# Patient Record
Sex: Male | Born: 1990 | Race: White | Hispanic: No | Marital: Single | Smoking: Current every day smoker
Health system: Southern US, Community
[De-identification: ages and names within clinical notes are randomized; demographics above are authoritative.]

## PROBLEM LIST (undated history)

## (undated) DIAGNOSIS — B029 Zoster without complications: Secondary | ICD-10-CM

## (undated) HISTORY — PX: APPENDECTOMY: SHX54

---
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
## 2011-01-29 ENCOUNTER — Emergency Department: Payer: Self-pay | Admitting: Emergency Medicine

## 2012-11-04 ENCOUNTER — Emergency Department: Payer: Self-pay | Admitting: Unknown Physician Specialty

## 2012-11-04 LAB — URINALYSIS, COMPLETE
Bilirubin,UR: NEGATIVE
Glucose,UR: NEGATIVE mg/dL (ref 0–75)
Nitrite: NEGATIVE
Ph: 8 (ref 4.5–8.0)
RBC,UR: 6 /HPF (ref 0–5)
Specific Gravity: 1.026 (ref 1.003–1.030)
WBC UR: <1 /HPF (ref 0–5)

## 2012-11-04 LAB — COMPREHENSIVE METABOLIC PANEL
Albumin: 5.1 g/dL — ABNORMAL HIGH (ref 3.4–5.0)
Anion Gap: 7 (ref 7–16)
Chloride: 106 mmol/L (ref 98–107)
Co2: 24 mmol/L (ref 21–32)
Creatinine: 0.78 mg/dL (ref 0.60–1.30)
EGFR (African American): >60
EGFR (Non-African Amer.): >60
Glucose: 112 mg/dL — ABNORMAL HIGH (ref 65–99)
Osmolality: 274 (ref 275–301)
SGOT(AST): 23 U/L (ref 15–37)
SGPT (ALT): 19 U/L (ref 12–78)
Total Protein: 8.5 g/dL — ABNORMAL HIGH (ref 6.4–8.2)

## 2012-11-04 LAB — CBC
MCH: 29.9 pg (ref 26.0–34.0)
MCV: 89 fL (ref 80–100)
Platelet: 223 10*3/uL (ref 150–440)
RBC: 5.23 10*6/uL (ref 4.40–5.90)
RDW: 12.9 % (ref 11.5–14.5)

## 2013-02-10 LAB — URINALYSIS, COMPLETE
Bilirubin,UR: NEGATIVE
Glucose,UR: 50 mg/dL (ref 0–75)
Leukocyte Esterase: NEGATIVE
Specific Gravity: 1.026 (ref 1.003–1.030)
Squamous Epithelial: <1
WBC UR: <1 /HPF (ref 0–5)

## 2013-02-10 LAB — COMPREHENSIVE METABOLIC PANEL
Anion Gap: 4 — ABNORMAL LOW (ref 7–16)
BUN: 9 mg/dL (ref 7–18)
Bilirubin,Total: 1.2 mg/dL — ABNORMAL HIGH (ref 0.2–1.0)
Calcium, Total: 9.7 mg/dL (ref 8.5–10.1)
Chloride: 104 mmol/L (ref 98–107)
Co2: 28 mmol/L (ref 21–32)
Creatinine: 1.02 mg/dL (ref 0.60–1.30)
EGFR (Non-African Amer.): >60
Glucose: 173 mg/dL — ABNORMAL HIGH (ref 65–99)
Osmolality: 275 (ref 275–301)
Potassium: 4.1 mmol/L (ref 3.5–5.1)
SGOT(AST): 21 U/L (ref 15–37)
Total Protein: 8.1 g/dL (ref 6.4–8.2)

## 2013-02-10 LAB — CBC
HCT: 48 % (ref 40.0–52.0)
MCH: 29.5 pg (ref 26.0–34.0)
Platelet: 235 10*3/uL (ref 150–440)
RBC: 5.38 10*6/uL (ref 4.40–5.90)
RDW: 13.8 % (ref 11.5–14.5)
WBC: 21.9 10*3/uL — ABNORMAL HIGH (ref 3.8–10.6)

## 2013-02-11 ENCOUNTER — Inpatient Hospital Stay: Payer: Self-pay | Admitting: Surgery

## 2013-02-13 LAB — CBC WITH DIFFERENTIAL/PLATELET
Basophil %: 0.3 %
Eosinophil #: 0.1 10*3/uL (ref 0.0–0.7)
Eosinophil %: 0.6 %
HCT: 46.4 % (ref 40.0–52.0)
HGB: 15.7 g/dL (ref 13.0–18.0)
Lymphocyte %: 10.2 %
MCHC: 33.9 g/dL (ref 32.0–36.0)
MCV: 91 fL (ref 80–100)
Monocyte %: 8.5 %
Platelet: 230 10*3/uL (ref 150–440)
RBC: 5.11 10*6/uL (ref 4.40–5.90)

## 2013-02-13 LAB — BASIC METABOLIC PANEL
BUN: 13 mg/dL (ref 7–18)
Calcium, Total: 9.2 mg/dL (ref 8.5–10.1)
Chloride: 102 mmol/L (ref 98–107)
EGFR (Non-African Amer.): >60
Potassium: 4 mmol/L (ref 3.5–5.1)

## 2013-02-14 LAB — CBC WITH DIFFERENTIAL/PLATELET
Basophil #: 0.1 10*3/uL (ref 0.0–0.1)
Eosinophil #: 0.1 10*3/uL (ref 0.0–0.7)
HGB: 13.5 g/dL (ref 13.0–18.0)
MCH: 30.3 pg (ref 26.0–34.0)
MCHC: 33.6 g/dL (ref 32.0–36.0)
MCV: 90 fL (ref 80–100)
Monocyte #: 1.3 x10 3/mm — ABNORMAL HIGH (ref 0.2–1.0)
Monocyte %: 9.6 %
Neutrophil #: 11 10*3/uL — ABNORMAL HIGH (ref 1.4–6.5)
Neutrophil %: 79.2 %
Platelet: 203 10*3/uL (ref 150–440)
RBC: 4.46 10*6/uL (ref 4.40–5.90)

## 2013-02-15 LAB — CBC WITH DIFFERENTIAL/PLATELET
Basophil #: 0.1 10*3/uL (ref 0.0–0.1)
Basophil %: 0.4 %
Eosinophil #: 0.2 10*3/uL (ref 0.0–0.7)
HCT: 39.8 % — ABNORMAL LOW (ref 40.0–52.0)
HGB: 13.7 g/dL (ref 13.0–18.0)
Lymphocyte #: 1.7 10*3/uL (ref 1.0–3.6)
Lymphocyte %: 10.5 %
MCH: 30.6 pg (ref 26.0–34.0)
MCHC: 34.4 g/dL (ref 32.0–36.0)
MCV: 89 fL (ref 80–100)
Monocyte %: 10 %
Neutrophil #: 12.9 10*3/uL — ABNORMAL HIGH (ref 1.4–6.5)
RDW: 13.8 % (ref 11.5–14.5)

## 2013-11-15 ENCOUNTER — Emergency Department: Payer: Self-pay | Admitting: Emergency Medicine

## 2013-11-15 LAB — CBC WITH DIFFERENTIAL/PLATELET
Basophil #: 0.1 10*3/uL (ref 0.0–0.1)
Basophil %: 1.1 %
EOS PCT: 1.8 %
Eosinophil #: 0.2 10*3/uL (ref 0.0–0.7)
HCT: 43.3 % (ref 40.0–52.0)
HGB: 14.7 g/dL (ref 13.0–18.0)
LYMPHS ABS: 2.9 10*3/uL (ref 1.0–3.6)
Lymphocyte %: 32.1 %
MCH: 30.6 pg (ref 26.0–34.0)
MCHC: 33.8 g/dL (ref 32.0–36.0)
MCV: 90 fL (ref 80–100)
Monocyte #: 0.7 x10 3/mm (ref 0.2–1.0)
Monocyte %: 8.1 %
NEUTROS ABS: 5.1 10*3/uL (ref 1.4–6.5)
NEUTROS PCT: 56.9 %
Platelet: 203 10*3/uL (ref 150–440)
RBC: 4.8 10*6/uL (ref 4.40–5.90)
RDW: 12.6 % (ref 11.5–14.5)
WBC: 8.9 10*3/uL (ref 3.8–10.6)

## 2013-11-15 LAB — URINALYSIS, COMPLETE
BILIRUBIN, UR: NEGATIVE
Bacteria: NONE SEEN
Blood: NEGATIVE
GLUCOSE, UR: NEGATIVE mg/dL (ref 0–75)
KETONE: NEGATIVE
Leukocyte Esterase: NEGATIVE
Nitrite: NEGATIVE
Ph: 7 (ref 4.5–8.0)
Protein: NEGATIVE
RBC,UR: <1 /HPF (ref 0–5)
SPECIFIC GRAVITY: 1.019 (ref 1.003–1.030)
Squamous Epithelial: <1

## 2013-11-15 LAB — COMPREHENSIVE METABOLIC PANEL
ALK PHOS: 76 U/L
AST: 27 U/L (ref 15–37)
Albumin: 4.2 g/dL (ref 3.4–5.0)
Anion Gap: 1 — ABNORMAL LOW (ref 7–16)
BILIRUBIN TOTAL: 0.3 mg/dL (ref 0.2–1.0)
BUN: 10 mg/dL (ref 7–18)
CO2: 31 mmol/L (ref 21–32)
Calcium, Total: 9 mg/dL (ref 8.5–10.1)
Chloride: 106 mmol/L (ref 98–107)
Creatinine: 0.81 mg/dL (ref 0.60–1.30)
Glucose: 92 mg/dL (ref 65–99)
Osmolality: 274 (ref 275–301)
Potassium: 4.5 mmol/L (ref 3.5–5.1)
SGPT (ALT): 36 U/L (ref 12–78)
Sodium: 138 mmol/L (ref 136–145)
TOTAL PROTEIN: 7.8 g/dL (ref 6.4–8.2)

## 2013-11-15 LAB — PROTIME-INR
INR: 1
Prothrombin Time: 13 secs (ref 11.5–14.7)

## 2013-11-15 LAB — LIPASE, BLOOD: Lipase: 1006 U/L — ABNORMAL HIGH (ref 73–393)

## 2014-11-22 NOTE — Discharge Summary (Signed)
PATIENT NAME:  Bill Harmon, Dequavius L MR#:  [id number] DATE OF BIRTH:  05/27/1991  DATE OF ADMISSION:  02/11/2013 DATE OF DISCHARGE:  02/15/2013  PRINCIPAL DIAGNOSIS: Ruptured acute appendicitis with pelvic abscess.   PRINCIPAL PROCEDURE PERFORMED DURING THIS ADMISSION: Laparoscopic appendectomy and laparoscopic abscess drainage.   HOSPITAL COURSE: The patient was admitted to the hospital following the above-mentioned procedure for the above-mentioned diagnosis and was kept on IV antibiotics until discharge. His vomiting and subsequent hiccups both resolved, and he was tolerating a regular diet and he was switched to oral antibiotics prior to discharge.   The patient was given an appointment to follow up in my office and call in the interim for any problems.   ____________________________ Claude MangesWilliam F. Stockton Nunley, MD wfm:gb D: 02/21/2013 04:22:49 ET T: 02/21/2013 04:42:12 ET JOB#: [id number]  cc: Claude MangesWilliam F. Ellisha Bankson, MD, <Dictator> Claude MangesWILLIAM F Maylee Bare MD ELECTRONICALLY SIGNED 02/21/2013 20:39

## 2014-11-22 NOTE — H&P (Signed)
History of Present Illness 22 yowm who began experiencing generalized severe, squeezing-type abdominal pain yesterday afternoon. He has been nauseous, vomited multiple times, and had chills, but no fever. He was unable to sleep last night. He had some water prior to arrival but his last real meal was yesterday; he is anorexic.   Past Med/Surgical Hx:  Asthma:   Denies surgical history.:   ALLERGIES:  No Known Allergies:   Family and Social History:  Family History Non-Contributory   Social History positive  tobacco (Current within 1 year), positive ETOH, positive Illicit drugs, single, cook at [employer], smokes 1/2 ppd, drinks rarely, smokes marijuana   Place of Living Home   Review of Systems:  Fever/Chills Yes   Cough No   Sputum No   Abdominal Pain Yes   Diarrhea No   Constipation No   Nausea/Vomiting Yes   SOB/DOE No   Chest Pain No   Dysuria No   Tolerating PT Yes   Tolerating Diet No  Nauseated  Vomiting   Medications/Allergies Reviewed Medications/Allergies reviewed   Physical Exam:  GEN well developed, well nourished, no acute distress, thin   HEENT pink conjunctivae, PERRL, hearing intact to voice, moist oral mucosa, Oropharynx clear   NECK supple  trachea midline   RESP normal resp effort  clear BS  no use of accessory muscles   CARD regular rate  no murmur  no JVD  no Rub   ABD positive tenderness  generalized rebound, worse in RLQ   LYMPH negative neck   EXTR negative cyanosis/clubbing, negative edema   SKIN normal to palpation, No rashes, skin turgor good   NEURO cranial nerves intact, follows commands, motor/sensory function intact   PSYCH alert, A+O to time, place, person, good insight   Lab Results: Hepatic:  12-Jul-14 06:54   Bilirubin, Total  1.2  Alkaline Phosphatase 88  SGPT (ALT) 23  SGOT (AST) 21  Total Protein, Serum 8.1  Albumin, Serum 4.4  Routine Chem:  12-Jul-14 06:54   Lipase 149 (Result(s) reported on  10 Feb 2013 at 07:31AM.)  Glucose, Serum  173  BUN 9  Creatinine (comp) 1.02  Sodium, Serum 136  Potassium, Serum 4.1  Chloride, Serum 104  CO2, Serum 28  Calcium (Total), Serum 9.7  Osmolality (calc) 275  eGFR (African American) >60  eGFR (Non-African American) >60 (eGFR values <8m/min/1.73 m2 may be an indication of chronic kidney disease (CKD). Calculated eGFR is useful in patients with stable renal function. The eGFR calculation will not be reliable in acutely ill patients when serum creatinine is changing rapidly. It is not useful in  patients on dialysis. The eGFR calculation may not be applicable to patients at the low and high extremes of body sizes, pregnant women, and vegetarians.)  Anion Gap  4  Routine UA:  12-Jul-14 06:54   Color (UA) Amber  Clarity (UA) Hazy  Glucose (UA) 50 mg/dL  Bilirubin (UA) Negative  Ketones (UA) Trace  Specific Gravity (UA) 1.026  Blood (UA) 1+  pH (UA) 6.0  Protein (UA) 30 mg/dL  Nitrite (UA) Negative  Leukocyte Esterase (UA) Negative (Result(s) reported on 10 Feb 2013 at 07:19AM.)  RBC (UA) 4 /HPF  WBC (UA) <1 /HPF  Bacteria (UA) TRACE  Epithelial Cells (UA) <1 /HPF  Mucous (UA) PRESENT (Result(s) reported on 10 Feb 2013 at 07:19AM.)  Routine Hem:  12-Jul-14 06:54   WBC (CBC)  21.9  RBC (CBC) 5.38  Hemoglobin (CBC) 15.9  Hematocrit (CBC) 48.0  Platelet Count (CBC) 235 (Result(s) reported on 10 Feb 2013 at 07:11AM.)  MCV 89  MCH 29.5  MCHC 33.1  RDW 13.8   Radiology Results: XRay:    12-Jul-14 07:26, Chest Portable Single View  Chest Portable Single View  REASON FOR EXAM:    eval for free air, severe abd pain  COMMENTS:       PROCEDURE: DXR - DXR PORTABLE CHEST SINGLE VIEW  - Feb 10 2013  7:26AM     RESULT: The lungs are clear. The heart and pulmonary vessels are normal.   The bony and mediastinal structures are unremarkable. There is no   effusion. There is no pneumothorax or evidence of congestive  failure.    IMPRESSION:  No acute cardiopulmonary disease.    Dictation Site: 6        Verified By: Sundra Aland, M.D., MD  LabUnknown:  PACS Image    12-Jul-14 07:42, CT Abdomen and Pelvis With Contrast  PACS Image  CT:  CT Abdomen and Pelvis With Contrast  REASON FOR EXAM:    (1) severe abd pain with guarding and rebound r>l;   (2) same  COMMENTS:       PROCEDURE: CT  - CT ABDOMEN / PELVIS  W  - Feb 10 2013  7:42AM     RESULT: CT of the abdomen and pelvis is performed utilizing 100 mL of   Isovue-370 iodinated intravenous contrast. Unfortunately, oral contrast   was not utilized. This was apparently at the request of the requesting   physician. The lung bases appear clear. The liver, gallbladder, spleen,   pancreas, adrenal glands, kidneys and abdominal aorta appear to be   unremarkable. There is a moderate amount of fluid in the stomach. The   optimal small bowel is slightly distended in the area the duodenum   without evidence of obstructive change. There is no pneumoperitoneum or   pneumatosis evident. There appear to be some possible inflammatory     changes in the right lower quadrant with a small amount of free fluid.   Unfortunately, given the lack of oral contrast the appendix is not well   seen. There may be some air in the appendix on image 110 toward the right   the pelvic region but the appendix is not clearly seen throughout its   length. There appears to be a small amount of fluid in the right lower   quadrant. The urinary bladder contains a small amount of fluid.    IMPRESSION:   1. There appears to be some free fluid in the right lower quadrant and   dependent region of the pelvis with evidence of inflammatory changes in   the right lower quadrant which could be secondary to enteritis or   possibly appendicitis although the appendix is not well seen. Close   clinical and laboratory correlation is recommended. The lack of oral   contrast limits  visualization of bowel loops in the pelvis in this   patient.  Dictation Site: 6        Verified By: Sundra Aland, M.D., MD    Assessment/Admission Diagnosis Ruptured Acute Appendicitis   Plan IVF, IV ABx, Lap Appy   Electronic Signatures: Consuela Mimes (MD)  (Signed 12-Jul-14 08:48)  Authored: CHIEF COMPLAINT and HISTORY, PAST MEDICAL/SURGIAL HISTORY, ALLERGIES, FAMILY AND SOCIAL HISTORY, REVIEW OF SYSTEMS, PHYSICAL EXAM, LABS, Radiology, ASSESSMENT AND PLAN   Last Updated: 12-Jul-14 08:48 by Consuela Mimes (MD)

## 2014-11-22 NOTE — Op Note (Signed)
PATIENT NAME:  Bill Harmon, Bill Harmon MR#:  [id number] DATE OF BIRTH:  09-01-1990  DATE OF PROCEDURE:  02/10/2013  PREOPERATIVE DIAGNOSIS: Ruptured acute appendicitis.   POSTOPERATIVE DIAGNOSES: Acute appendicitis plus pelvic abscess.   SURGEON: Claude Manges, M.D.   ANESTHESIA: General.   OPERATION PERFORMED: 1.  Laparoscopic appendectomy.  2.  Laparoscopic drainage of pelvic abscess.   PROCEDURE IN DETAIL: The patient was placed supine on the operating room table and prepped and draped in the usual sterile fashion. An incision was made above the umbilicus in the midline and carried down through the linea alba and a Hasson cannula was introduced into the peritoneum amidst horizontal mattress sutures of 0 Vicryl. A 15 mmHg CO2 pneumoperitoneum was created. Two additional 5 mm trocars were placed under direct visualization. The patient was noted to have at least 50 mL of pus in the pelvis that was free-floating, and there was also some pus along the right gutter. There were dense adhesions from the ascending colon and the cecum to the right gutter and the right pelvic sidewall, and there were also dense adhesions from the terminal ileum to the pelvis. These adhesions were taken down with the Harmonic scalpel, and the entire ileocecal area of intestine was rotated medially in order to expose the appendix. In doing so, there was an additional fairly mature pelvic abscess that was entered that had been walled off by the pelvis and pelvic sidewall and small intestine. This was all suctioned clear, and preliminary irrigation was performed. The appendix was plastered to the right pelvic sidewall, and the proximal half of the appendix appeared completely normal. The distal half of the appendix appeared very cyanotic, and there appeared to be a rupture in the mid portion of the appendix. The appendix was tediously dissected off of the pelvic sidewall and in doing so, a single small arterial bleeder was  encountered and this was controlled with 3 large hemoclips. I never identified the ureter, and it is possible that these clips were placed close to the ureter, although they certainly were not placed across it. The mesoappendix was taken down with the Harmonic scalpel, and the appendectomy was performed with an Endo GIA stapling device and the appendix was placed in an Endo Catch bag and extracted from the abdomen via the supraumbilical port. The appendectomy was complete, leaving no remaining appendix in the patient, as it was done just at the junction of the appendix with the cecum. I then performed  extensive irrigation of the areas of the previous abscesses, including the right gutter and pelvis and distal small intestine, to include almost 2 liters of warm normal saline. This was suctioned clear from both the right and left subdiaphragmatic areas as well as the right gutter and pelvis. I then pulled the (very diminutive and thin) omentum over top of the small intestine and the cecum, such that it reached the right gutter and the dissected area in the pelvis. It did not quite reach the bottom of the pelvis, and the previously adherent terminal ileum had slid back into that area. The peritoneum was then desufflated and decannulated, and the linea alba was closed with a running 0 PDS suture and the previously placed Vicryls were tied one to another, and all 3 skin sites were closed with subcuticular 5-0 Monocryl and suture strips. The patient tolerated the procedure well. There were no complications.     ____________________________ Claude Manges, MD wfm:jm D: 02/10/2013 12:29:23 ET T: 02/10/2013 13:47:49 ET JOB#:  [id number]  cc: Claude MangesWilliam F. Brandin Stetzer, MD, <Dictator> Claude MangesWILLIAM F Mayer Vondrak MD ELECTRONICALLY SIGNED 02/10/2013 14:14

## 2014-12-13 IMAGING — CT CT ABD-PELV W/ CM
2 of 4 series · 17 of 46 positions shown, 19 images · IV contrast (agent unspecified)
Comparison: CT scan dated 02/10/2013

CLINICAL DATA: Abdominal pain and tenderness.

EXAM:
CT ABDOMEN AND PELVIS WITH CONTRAST
TECHNIQUE: Multidetector CT imaging of the abdomen and pelvis was performed
using the standard protocol following bolus administration of
intravenous contrast.
CONTRAST:  100 cc 8sovue-QLL

[Series 2: routine abd pel with · axial · 0.70mm/px · z∈[+152,+572]mm · 14 of 92 slices shown, 16 images]
[im 4/92  soft-tissue]
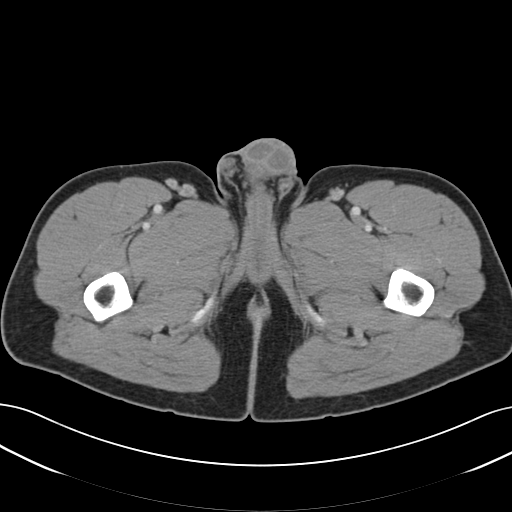
[im 4/92  bone]
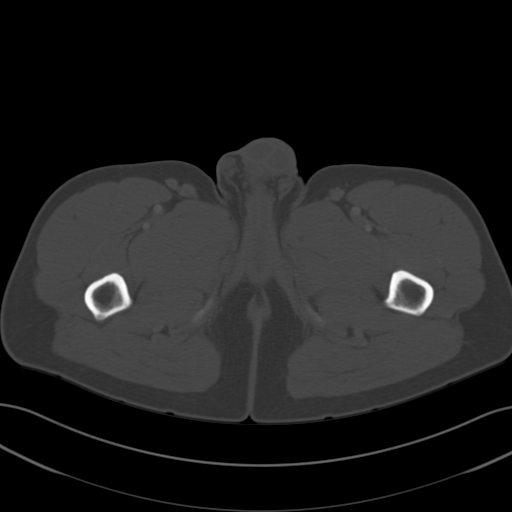
[im 11/92  soft-tissue]
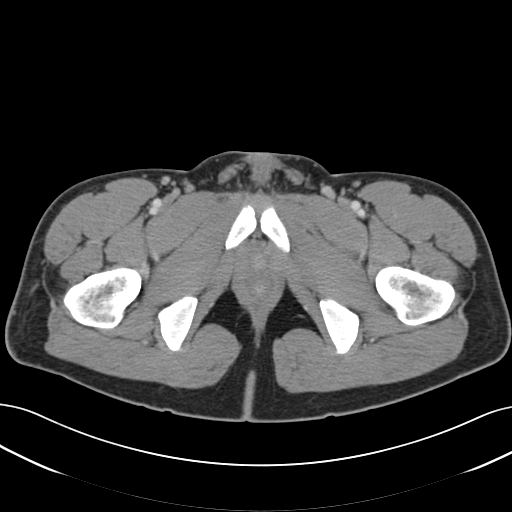
[im 19/92  soft-tissue]
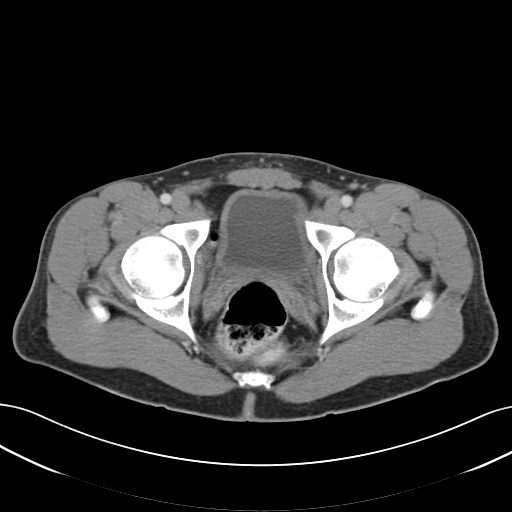
[im 26/92  soft-tissue]
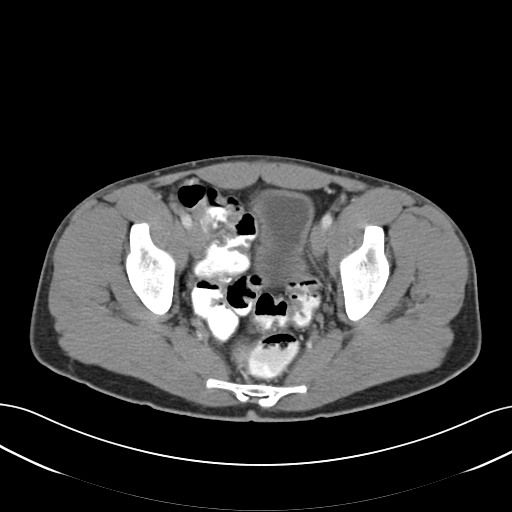
[im 30/92  soft-tissue]
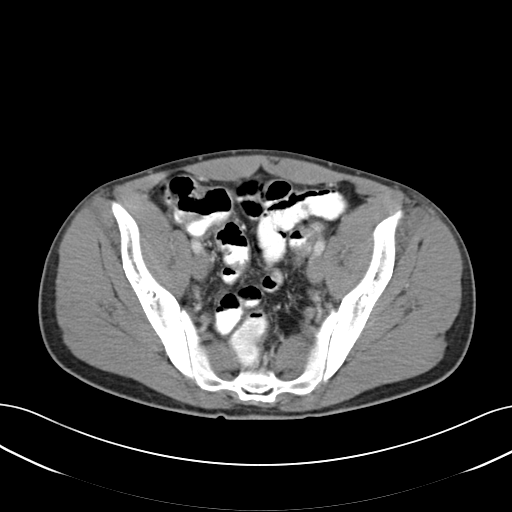
[im 37/92  soft-tissue]
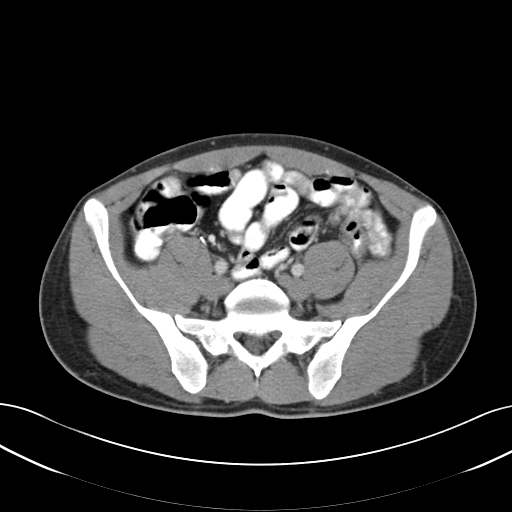
[im 44/92  soft-tissue]
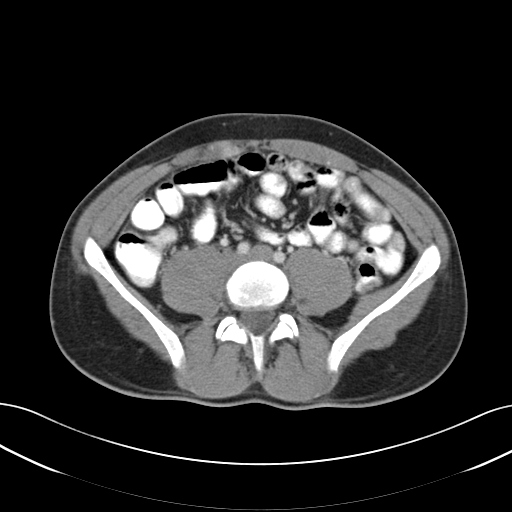
[im 48/92  soft-tissue]
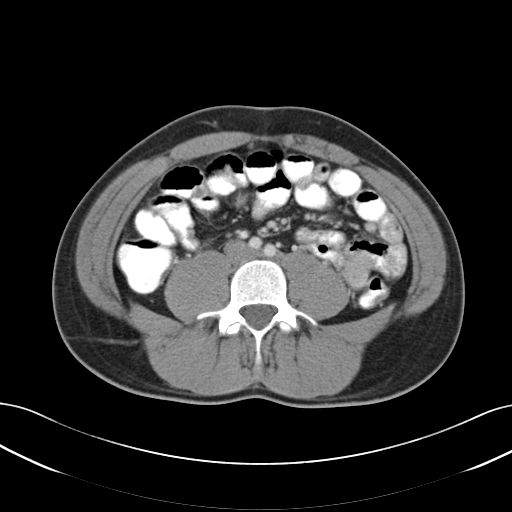
[im 55/92  soft-tissue]
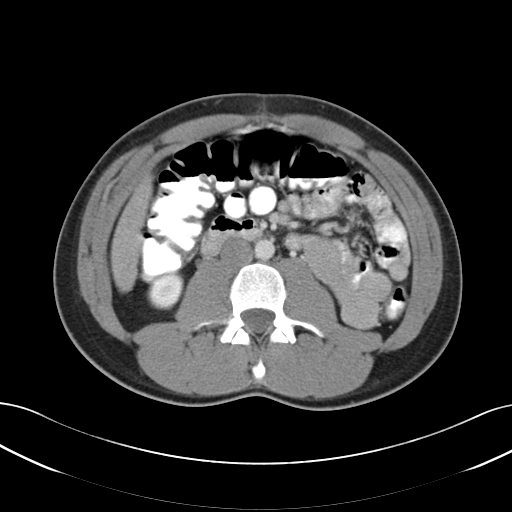
[im 55/92  bone]
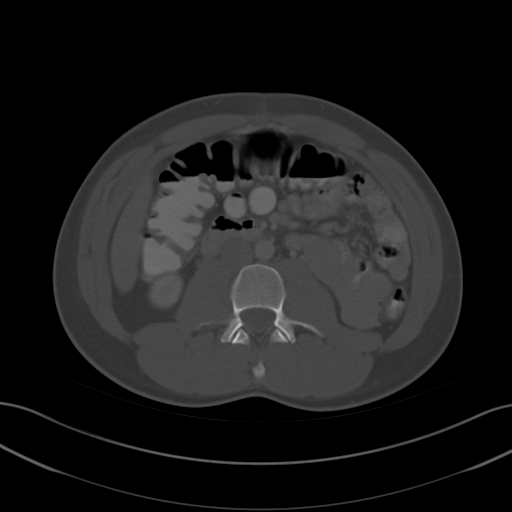
[im 62/92  soft-tissue]
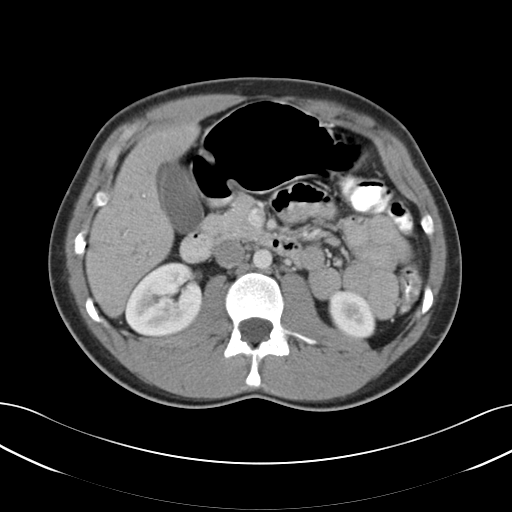
[im 70/92  soft-tissue]
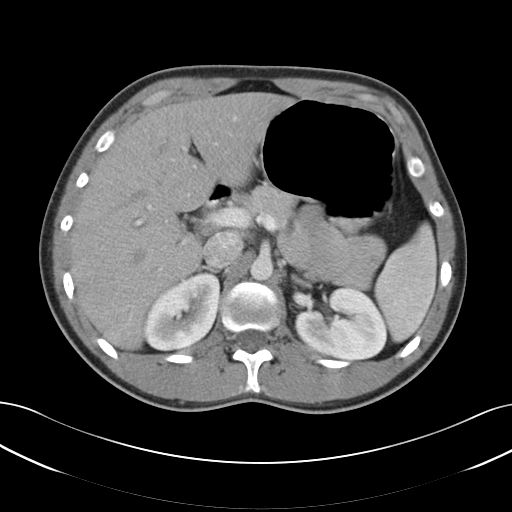
[im 73/92  soft-tissue]
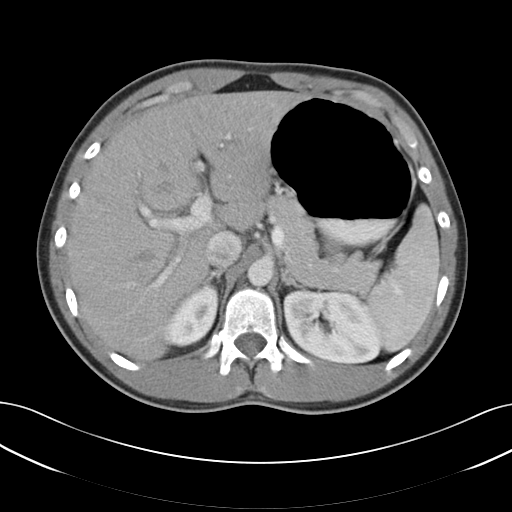
[im 81/92  soft-tissue]
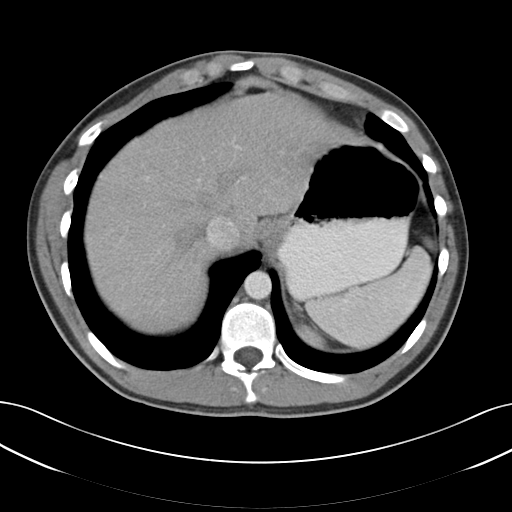
[im 88/92  soft-tissue]
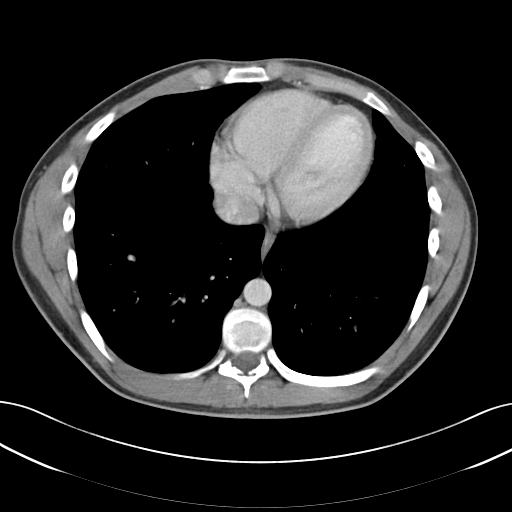

[Series 5: cor routine abd pel with · coronal · 0.69mm/px · 3 of 118 slices shown]
[im 40/118  soft-tissue]
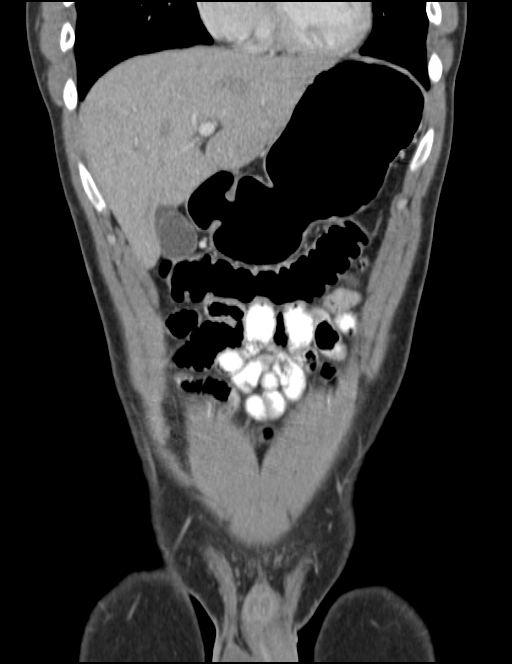
[im 53/118  soft-tissue]
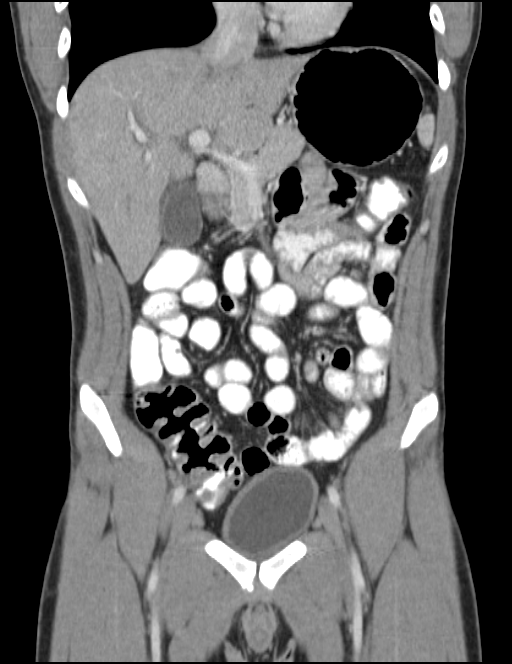
[im 66/118  soft-tissue]
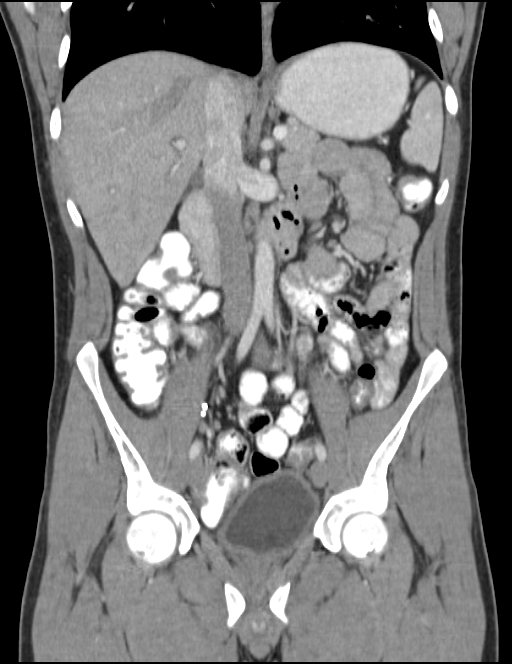

[17 of 46 positions shown; findings below may reference images not displayed]

FINDINGS: There has slight edema of the gallbladder wall buttock gallbladder
is not distended. There are no dilated bile ducts.

Liver parenchyma, spleen, pancreas, adrenal glands, and kidneys are
normal. The bowel is normal including the terminal ileum. Appendix
has been removed. There is no free air or free fluid in the abdomen.
No osseous abnormality. No adenopathy.
IMPRESSION: Slight nonspecific edema in the gallbladder wall. Otherwise benign
appearing abdomen.

## 2014-12-18 ENCOUNTER — Ambulatory Visit
Admission: EM | Admit: 2014-12-18 | Discharge: 2014-12-18 | Disposition: A | Discharge status: Home / Self Care | Payer: BC Managed Care – PPO | Attending: Family Medicine | Admitting: Family Medicine

## 2014-12-18 DIAGNOSIS — B029 Zoster without complications: Secondary | ICD-10-CM

## 2014-12-18 MED ORDER — HYDROCODONE-ACETAMINOPHEN 5-325 MG PO TABS: ORAL_TABLET | ORAL | Status: DC

## 2014-12-18 MED ORDER — VALACYCLOVIR HCL 1 G PO TABS: 1000.0000 mg | ORAL_TABLET | Freq: Three times a day (TID) | ORAL | Status: DC

## 2014-12-18 NOTE — ED Provider Notes (Signed)
CSN: [id number]     Arrival date & time 12/18/14  1842 History   First MD Initiated Contact with Patient 12/18/14 1919     Chief Complaint  Patient presents with  . Rash   (Consider location/radiation/quality/duration/timing/severity/associated sxs/prior Treatment) Patient is a 24 y.o. male presenting with rash. The history is provided by the patient.  Rash Location:  Torso Torso rash location:  R axilla, R chest and upper back Quality: blistering, burning, painful and redness   Pain details:    Quality:  Burning   Severity:  Moderate   Onset quality:  Sudden   Timing:  Constant Severity:  Moderate Context: not animal contact, not chemical exposure, not diapers, not eggs, not exposure to similar rash, not food, not hot tub use, not insect bite/sting, not medications, not new detergent/soap, not nuts, not plant contact, not pollen, not pregnancy, not sick contacts and not sun exposure   Ineffective treatments:  None tried Associated symptoms: no abdominal pain and no sore throat     History reviewed. No pertinent past medical history. Past Surgical History  Procedure Laterality Date  . Appendectomy     No family history on file. History  Substance Use Topics  . Smoking status: Never Smoker   . Smokeless tobacco: Not on file  . Alcohol Use: No    Review of Systems  HENT: Negative for sore throat.   Gastrointestinal: Negative for abdominal pain.  Skin: Positive for rash.    Allergies  Review of patient's allergies indicates no known allergies.  Home Medications   Prior to Admission medications   Medication Sig Start Date End Date Taking? Authorizing Provider  HYDROcodone-acetaminophen (NORCO/VICODIN) 5-325 MG per tablet 1-2 tabs po qd prn 12/18/14   Payton Mccallumrlando Dorotea Hand, MD  valACYclovir (VALTREX) 1000 MG tablet Take 1 tablet (1,000 mg total) by mouth 3 (three) times daily. 12/18/14   Payton Mccallumrlando Shayli Altemose, MD   BP 138/78 mmHg  Pulse 74  Temp(Src) 98.5 F (36.9 C) (Tympanic)   Resp 16  Ht 5\' 11"  (1.803 m)  Wt 145 lb (65.772 kg)  BMI 20.23 kg/m2  SpO2 99% Physical Exam  Constitutional: He appears well-developed and well-nourished. No distress.  HENT:  Head: Normocephalic and atraumatic.  Right Ear: Tympanic membrane and ear canal normal.  Left Ear: Tympanic membrane and ear canal normal.  Mouth/Throat: Uvula is midline and mucous membranes are normal. No tonsillar abscesses.  Neck: Normal range of motion. Neck supple.  Pulmonary/Chest: Effort normal. No respiratory distress.  Neurological: He is alert.  Skin: Skin is warm and dry. Rash noted. Rash is vesicular. He is not diaphoretic.     Nursing note and vitals reviewed.   ED Course  Procedures (including critical care time) Labs Review Labs Reviewed - No data to display  Imaging Review No results found.   MDM   1. Zoster    Discharge Medication List as of 12/18/2014  7:36 PM    START taking these medications   Details  HYDROcodone-acetaminophen (NORCO/VICODIN) 5-325 MG per tablet 1-2 tabs po qd prn, Print    valACYclovir (VALTREX) 1000 MG tablet Take 1 tablet (1,000 mg total) by mouth 3 (three) times daily., Starting 12/18/2014, Until Discontinued, Normal       Plan: 1. Diagnosis reviewed with patient 2. rx as per orders; risks, benefits, potential side effects reviewed with patient 3. Recommend supportive treatment with otc analgesics 4. F/u prn if symptoms worsen or don't improve    Payton Mccallumrlando Pedrohenrique Mccon[place], MD 12/18/14 1944

## 2014-12-18 NOTE — Discharge Instructions (Signed)

## 2014-12-18 NOTE — ED Notes (Signed)
Pt states "I have a rash on my back that is spreading, developed Saturday."

## 2015-01-01 ENCOUNTER — Encounter: Payer: Self-pay | Admitting: Emergency Medicine

## 2015-01-01 ENCOUNTER — Emergency Department
Admission: EM | Admit: 2015-01-01 | Discharge: 2015-01-01 | Disposition: A | Discharge status: Home / Self Care | Payer: BC Managed Care – PPO | Attending: Emergency Medicine | Admitting: Emergency Medicine

## 2015-01-01 ENCOUNTER — Emergency Department: Payer: BC Managed Care – PPO

## 2015-01-01 DIAGNOSIS — R112 Nausea with vomiting, unspecified: Secondary | ICD-10-CM | POA: Diagnosis not present

## 2015-01-01 DIAGNOSIS — M546 Pain in thoracic spine: Secondary | ICD-10-CM | POA: Insufficient documentation

## 2015-01-01 DIAGNOSIS — Z720 Tobacco use: Secondary | ICD-10-CM | POA: Diagnosis not present

## 2015-01-01 DIAGNOSIS — Z9119 Patient's noncompliance with other medical treatment and regimen: Secondary | ICD-10-CM | POA: Diagnosis not present

## 2015-01-01 DIAGNOSIS — R509 Fever, unspecified: Secondary | ICD-10-CM | POA: Diagnosis present

## 2015-01-01 HISTORY — DX: Zoster without complications: B02.9

## 2015-01-01 LAB — COMPREHENSIVE METABOLIC PANEL
ALBUMIN: 4.9 g/dL (ref 3.5–5.0)
ALT: 25 U/L (ref 17–63)
AST: 24 U/L (ref 15–41)
Alkaline Phosphatase: 54 U/L (ref 38–126)
Anion gap: 9 (ref 5–15)
BILIRUBIN TOTAL: 0.8 mg/dL (ref 0.3–1.2)
BUN: 15 mg/dL (ref 6–20)
CO2: 28 mmol/L (ref 22–32)
Calcium: 9.8 mg/dL (ref 8.9–10.3)
Chloride: 100 mmol/L — ABNORMAL LOW (ref 101–111)
Creatinine, Ser: 1.03 mg/dL (ref 0.61–1.24)
GFR calc Af Amer: >60 mL/min (ref >60)
GLUCOSE: 97 mg/dL (ref 65–99)
Potassium: 4.1 mmol/L (ref 3.5–5.1)
Sodium: 137 mmol/L (ref 135–145)
Total Protein: 8.6 g/dL — ABNORMAL HIGH (ref 6.5–8.1)

## 2015-01-01 LAB — CBC WITH DIFFERENTIAL/PLATELET
BASOS ABS: 0.1 10*3/uL (ref 0–0.1)
BASOS PCT: 1 %
Eosinophils Absolute: 0 10*3/uL (ref 0–0.7)
Eosinophils Relative: 0 %
HEMATOCRIT: 43.7 % (ref 40.0–52.0)
HEMOGLOBIN: 14.6 g/dL (ref 13.0–18.0)
LYMPHS ABS: 1.7 10*3/uL (ref 1.0–3.6)
Lymphocytes Relative: 12 %
MCH: 29.7 pg (ref 26.0–34.0)
MCHC: 33.4 g/dL (ref 32.0–36.0)
MCV: 89.1 fL (ref 80.0–100.0)
MONOS PCT: 9 %
Monocytes Absolute: 1.2 10*3/uL — ABNORMAL HIGH (ref 0.2–1.0)
NEUTROS ABS: 10.9 10*3/uL — AB (ref 1.4–6.5)
NEUTROS PCT: 78 %
PLATELETS: 202 10*3/uL (ref 150–440)
RBC: 4.91 MIL/uL (ref 4.40–5.90)
RDW: 13.1 % (ref 11.5–14.5)
WBC: 14 10*3/uL — ABNORMAL HIGH (ref 3.8–10.6)

## 2015-01-01 LAB — URINALYSIS COMPLETE WITH MICROSCOPIC (ARMC ONLY)
Bacteria, UA: NONE SEEN
Bilirubin Urine: NEGATIVE
GLUCOSE, UA: NEGATIVE mg/dL
HGB URINE DIPSTICK: NEGATIVE
LEUKOCYTES UA: NEGATIVE
Nitrite: NEGATIVE
PH: 8 (ref 5.0–8.0)
PROTEIN: NEGATIVE mg/dL
Specific Gravity, Urine: 1.019 (ref 1.005–1.030)

## 2015-01-01 LAB — POCT RAPID STREP A: STREPTOCOCCUS, GROUP A SCREEN (DIRECT): NEGATIVE

## 2015-01-01 MED ORDER — SODIUM CHLORIDE 0.9 % IV SOLN: Freq: Once | INTRAVENOUS | Status: DC

## 2015-01-01 MED ORDER — ONDANSETRON HCL 4 MG PO TABS: 4.0000 mg | ORAL_TABLET | Freq: Every day | ORAL | Status: DC | PRN

## 2015-01-01 MED ORDER — DIAZEPAM 5 MG PO TABS
ORAL_TABLET | ORAL | Status: AC
  Filled 2015-01-01: qty 1

## 2015-01-01 MED ORDER — IBUPROFEN 800 MG PO TABS: 800.0000 mg | ORAL_TABLET | Freq: Three times a day (TID) | ORAL | Status: DC | PRN

## 2015-01-01 MED ORDER — KETOROLAC TROMETHAMINE 30 MG/ML IJ SOLN: 30.0000 mg | Freq: Once | INTRAMUSCULAR | Status: DC

## 2015-01-01 MED ORDER — DIAZEPAM 5 MG PO TABS: 5.0000 mg | ORAL_TABLET | Freq: Three times a day (TID) | ORAL | Status: DC | PRN

## 2015-01-01 MED ORDER — DIAZEPAM 5 MG PO TABS
5.0000 mg | ORAL_TABLET | Freq: Once | ORAL | Status: AC
  Administered 2015-01-01: 5 mg via ORAL

## 2015-01-01 MED ORDER — KETOROLAC TROMETHAMINE 30 MG/ML IJ SOLN
INTRAMUSCULAR | Status: AC
  Filled 2015-01-01: qty 1

## 2015-01-01 NOTE — ED Notes (Signed)
Fever this am    Nausea last pm  Was dx'd with shingles about 2 weeks ago  Had some vomiting this am  Last time vomited was about 630 am

## 2015-01-01 NOTE — ED Notes (Signed)
Pt refusing IV medications and IVF's. States he does not want nurse to place and IV. Mayford KnifeWilliams, MD brought to bedside. Pt continues to refuse. Orders to be discontinued per MD

## 2015-01-01 NOTE — ED Provider Notes (Signed)
[facility] Emergency Department Provider Note     Time seen: ----------------------------------------- 11:02 AM on 01/01/2015 -----------------------------------------    I have reviewed the triage vital signs and the nursing notes.   HISTORY  Chief Complaint Fever    HPI Bill Harmon is a 24 y.o. male since ER for severe left-sided upper back and axillary pain. Patient states also nauseous last p.m. with vomiting. States he last vomited 6 sinus morning. Patient states last night developed fever nausea vomiting bodyaches. Was recently treated for shingles in the right upper back. States has gotten better and also knee had burning constant left-sided upper back pain. Nothing makes it better or worse.     Past Medical History  Diagnosis Date  . Shingles unk    There are no active problems to display for this patient.   Past Surgical History  Procedure Laterality Date  . Appendectomy      Current Outpatient Rx  Name  Route  Sig  Dispense  Refill  . ibuprofen (ADVIL,MOTRIN) 200 MG tablet   Oral   Take 400 mg by mouth every 6 (six) hours as needed for moderate pain.          Marland Kitchen HYDROcodone-acetaminophen (NORCO/VICODIN) 5-325 MG per tablet      1-2 tabs po qd prn Patient not taking: Reported on 01/01/2015   6 tablet   0   . valACYclovir (VALTREX) 1000 MG tablet   Oral   Take 1 tablet (1,000 mg total) by mouth 3 (three) times daily. Patient not taking: Reported on 01/01/2015   21 tablet   0     Allergies Review of patient's allergies indicates no known allergies.  History reviewed. No pertinent family history.  Social History History  Substance Use Topics  . Smoking status: Current Every Day Smoker  . Smokeless tobacco: Not on file  . Alcohol Use: No    Review of Systems Constitutional: Positive fever Eyes: Negative for visual changes. ENT: Negative for sore throat. Cardiovascular: Negative for chest pain. Respiratory:  Negative for shortness of breath. Gastrointestinal: Negative for abdominal pain, positive for vomiting Genitourinary: Negative for dysuria. Musculoskeletal: Positive for back pain Skin: Negative for rash. Neurological: Negative for headaches, focal weakness or numbness.  10-point ROS otherwise negative.  ____________________________________________   PHYSICAL EXAM:  VITAL SIGNS: ED Triage Vitals  Enc Vitals Group     BP 01/01/15 0810 136/73 mmHg     Pulse Rate 01/01/15 0810 110     Resp 01/01/15 0810 20     Temp 01/01/15 0810 100.2 F (37.9 C)     Temp Source 01/01/15 0810 Oral     SpO2 01/01/15 0810 98 %     Weight 01/01/15 0810 150 lb (68.04 kg)     Height 01/01/15 0810  (1.778 m)     Head Cir --      Peak Flow --      Pain Score 01/01/15 0811 7     Pain Loc --      Pain Edu? --      Excl. in GC? --     Constitutional: Alert and oriented. Mild distress, anxious Eyes: Conjunctivae are normal. PERRL. Normal extraocular movements. ENT   Head: Normocephalic and atraumatic.   Nose: No congestion/rhinnorhea.   Mouth/Throat: Mild erythema   Neck: No stridor. Hematological/Lymphatic/Immunilogical: No cervical lymphadenopathy. Cardiovascular: Normal rate, regular rhythm. Normal and symmetric distal pulses are present in all extremities. No murmurs, rubs, or gallops. Respiratory: Normal respiratory  effort without tachypnea nor retractions. Breath sounds are clear and equal bilaterally. No wheezes/rales/rhonchi. Gastrointestinal: Soft and nontender. No distention. No abdominal bruits. There is no CVA tenderness. Musculoskeletal: Tenderness to touch throughout the left side of his upper back, nonfocal. Neurologic:  Normal speech and language. No gross focal neurologic deficits are appreciated. Speech is normal. No gait instability. Skin:  No active skin lesions are noted, previous scarring from zoster is present in the right axilla Psychiatric: Mood and affect  are normal.   ____________________________________________    LABS (pertinent positives/negatives)  Labs Reviewed  CBC WITH DIFFERENTIAL/PLATELET - Abnormal; Notable for the following:    WBC 14.0 (*)    Neutro Abs 10.9 (*)    Monocytes Absolute 1.2 (*)    All other components within normal limits  COMPREHENSIVE METABOLIC PANEL - Abnormal; Notable for the following:    Chloride 100 (*)    Total Protein 8.6 (*)    All other components within normal limits  URINALYSIS COMPLETEWITH MICROSCOPIC ([facility] ONLY) - Abnormal; Notable for the following:    Color, Urine YELLOW (*)    APPearance CLEAR (*)    Ketones, ur TRACE (*)    Squamous Epithelial / LPF 0-5 (*)    All other components within normal limits   ____________________________________________  ED COURSE:  Pertinent labs & imaging results that were available during my care of the patient were reviewed by me and considered in my medical decision making (see chart for details).  Patient with nonspecific muscular pain. Also with pharyngitis. Symptoms likely related to viral gastroenteritis with associated muscle spasm from vomiting. ____________________________________________   RADIOLOGY  None  ____________________________________________    FINAL ASSESSMENT AND PLAN  Fever, vomiting, back pain. Plan: Patient received IV normal saline, Toradol, Zofran. An Valium. Patient be discharged with Motrin and Valium and Zofran. He is stable for outpatient follow-up.   Emily FilbertWilliams, Jonathan E, MD   Emily FilbertJonathan E Williams, MD 01/01/15 [phone]

## 2015-01-01 NOTE — ED Notes (Signed)
Developed fever last pm with pos n/v  Body aches

## 2015-01-01 NOTE — Discharge Instructions (Signed)
Back Exercises Back exercises help treat and prevent back injuries. The goal of back exercises is to increase the strength of your abdominal and back muscles and the flexibility of your back. These exercises should be started when you no longer have back pain. Back exercises include:  Pelvic Tilt. Lie on your back with your knees bent. Tilt your pelvis until the lower part of your back is against the floor. Hold this position 5 to 10 sec and repeat 5 to 10 times.  Knee to Chest. Pull first 1 knee up against your chest and hold for 20 to 30 seconds, repeat this with the other knee, and then both knees. This may be done with the other leg straight or bent, whichever feels better.  Sit-Ups or Curl-Ups. Bend your knees 90 degrees. Start with tilting your pelvis, and do a partial, slow sit-up, lifting your trunk only 30 to 45 degrees off the floor. Take at least 2 to 3 seconds for each sit-up. Do not do sit-ups with your knees out straight. If partial sit-ups are difficult, simply do the above but with only tightening your abdominal muscles and holding it as directed.  Hip-Lift. Lie on your back with your knees flexed 90 degrees. Push down with your feet and shoulders as you raise your hips a couple inches off the floor; hold for 10 seconds, repeat 5 to 10 times.  Back arches. Lie on your stomach, propping yourself up on bent elbows. Slowly press on your hands, causing an arch in your low back. Repeat 3 to 5 times. Any initial stiffness and discomfort should lessen with repetition over time.  Shoulder-Lifts. Lie face down with arms beside your body. Keep hips and torso pressed to floor as you slowly lift your head and shoulders off the floor. Do not overdo your exercises, especially in the beginning. Exercises may cause you some mild back discomfort which lasts for a few minutes; however, if the pain is more severe, or lasts for more than 15 minutes, do not continue exercises until you see your caregiver.  Improvement with exercise therapy for back problems is slow.  See your caregivers for assistance with developing a proper back exercise program. Document Released: 08/26/2004 Document Revised: 10/11/2011 Document Reviewed: 05/20/2011 [facility] Patient Information 2015 [place], [place]. This information is not intended to replace advice given to you by your health care provider. Make sure you discuss any questions you have with your health care provider.  Nausea, Adult Nausea is the feeling that you have an upset stomach or have to vomit. Nausea by itself is not likely a serious concern, but it may be an early sign of more serious medical problems. As nausea gets worse, it can lead to vomiting. If vomiting develops, there is the risk of dehydration.  CAUSES   Viral infections.  Food poisoning.  Medicines.  Pregnancy.  Motion sickness.  Migraine headaches.  Emotional distress.  Severe pain from any source.  Alcohol intoxication. HOME CARE INSTRUCTIONS  Get plenty of rest.  Ask your caregiver about specific rehydration instructions.  Eat small amounts of food and sip liquids more often.  Take all medicines as told by your caregiver. SEEK MEDICAL CARE IF:  You have not improved after 2 days, or you get worse.  You have a headache. SEEK IMMEDIATE MEDICAL CARE IF:   You have a fever.  You faint.  You keep vomiting or have blood in your vomit.  You are extremely weak or dehydrated.  You have dark or  bloody stools.  You have severe chest or abdominal pain. MAKE SURE YOU:  Understand these instructions.  Will watch your condition.  Will get help right away if you are not doing well or get worse. Document Released: 08/26/2004 Document Revised: 04/12/2012 Document Reviewed: 03/31/2011 [facility] Patient Information 2015 [place]. This information is not intended to replace advice given to you by your health care provider. Make sure you discuss any questions you  have with your health care provider.

## 2015-01-04 LAB — CULTURE, GROUP A STREP (THRC)

## 2016-01-29 IMAGING — DX DG CHEST 2V
2 series · 2 of 2 positions shown · non-contrast
Comparison: February 10, 2013

CLINICAL DATA: Fever and shortness of breath

EXAM:
CHEST  2 VIEW

[chest pa]
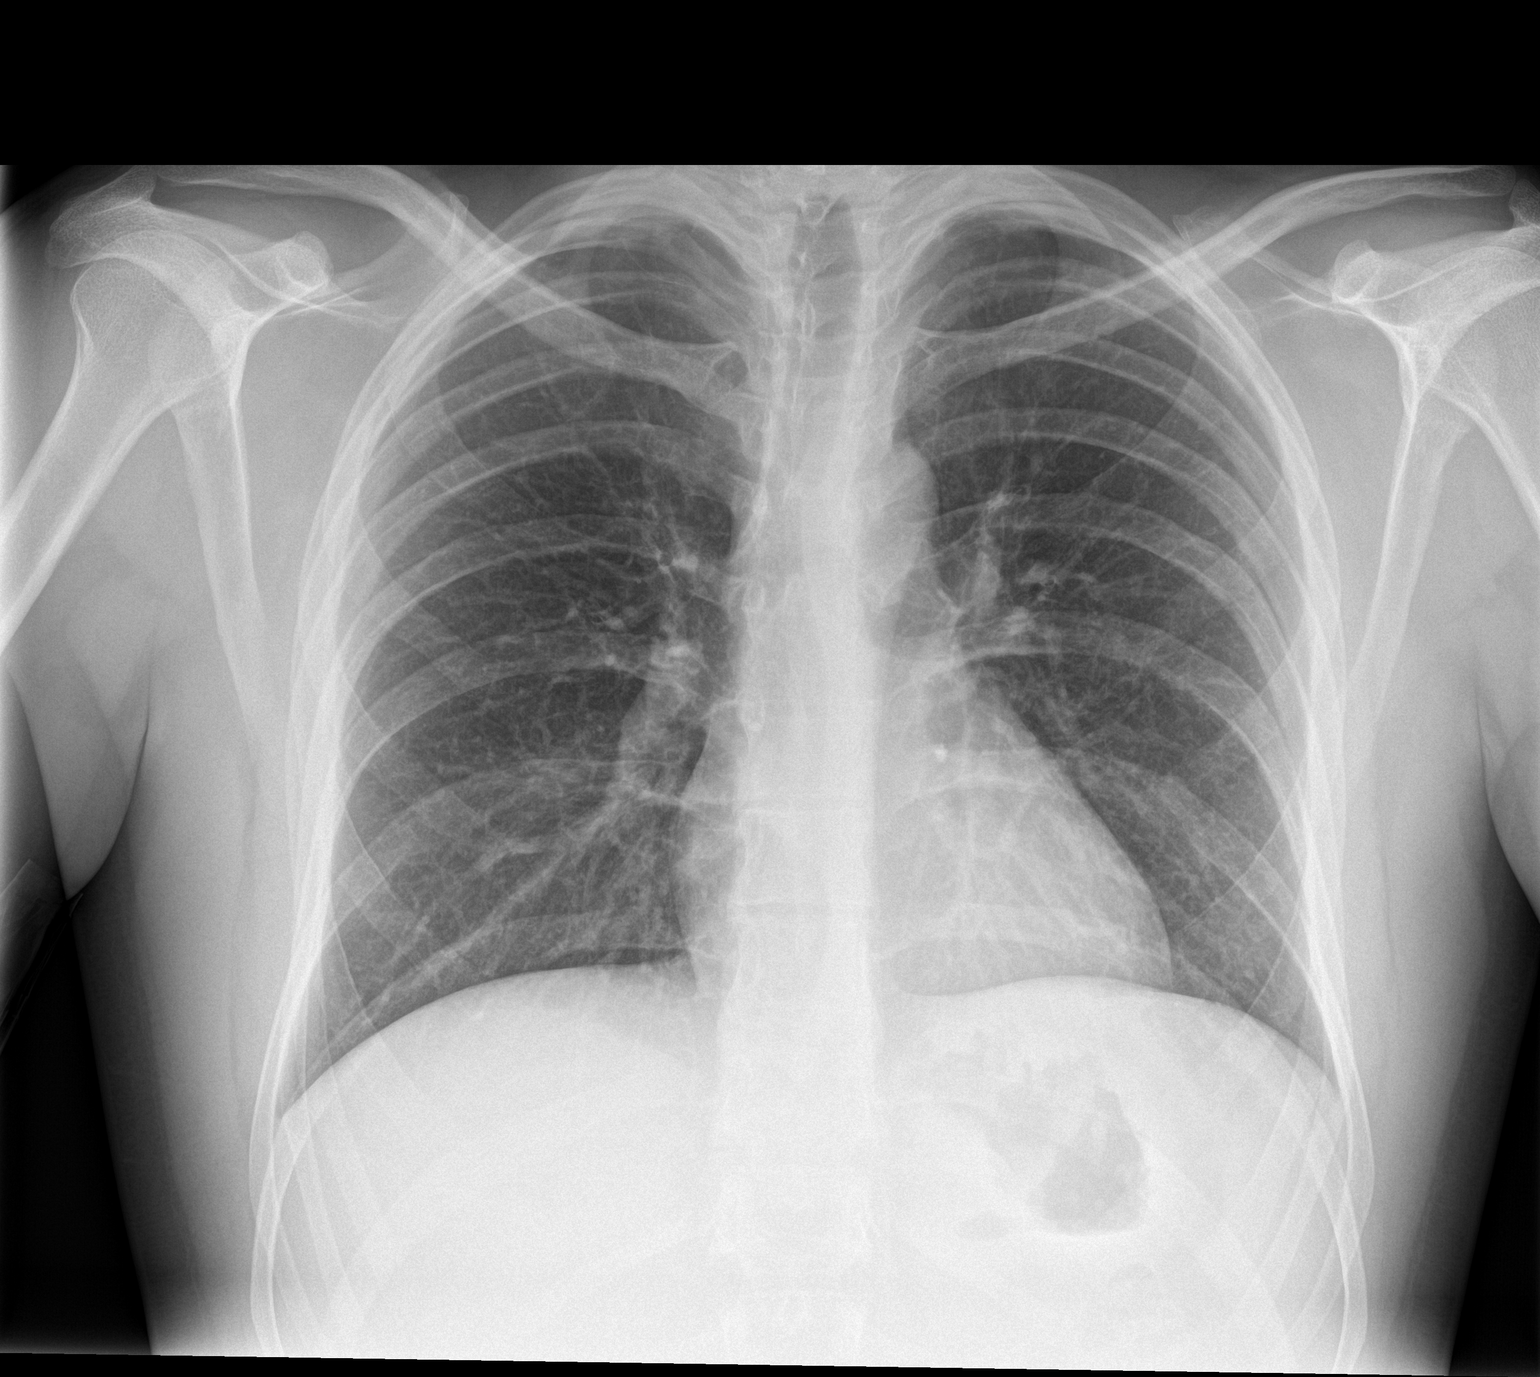

[chest lat]
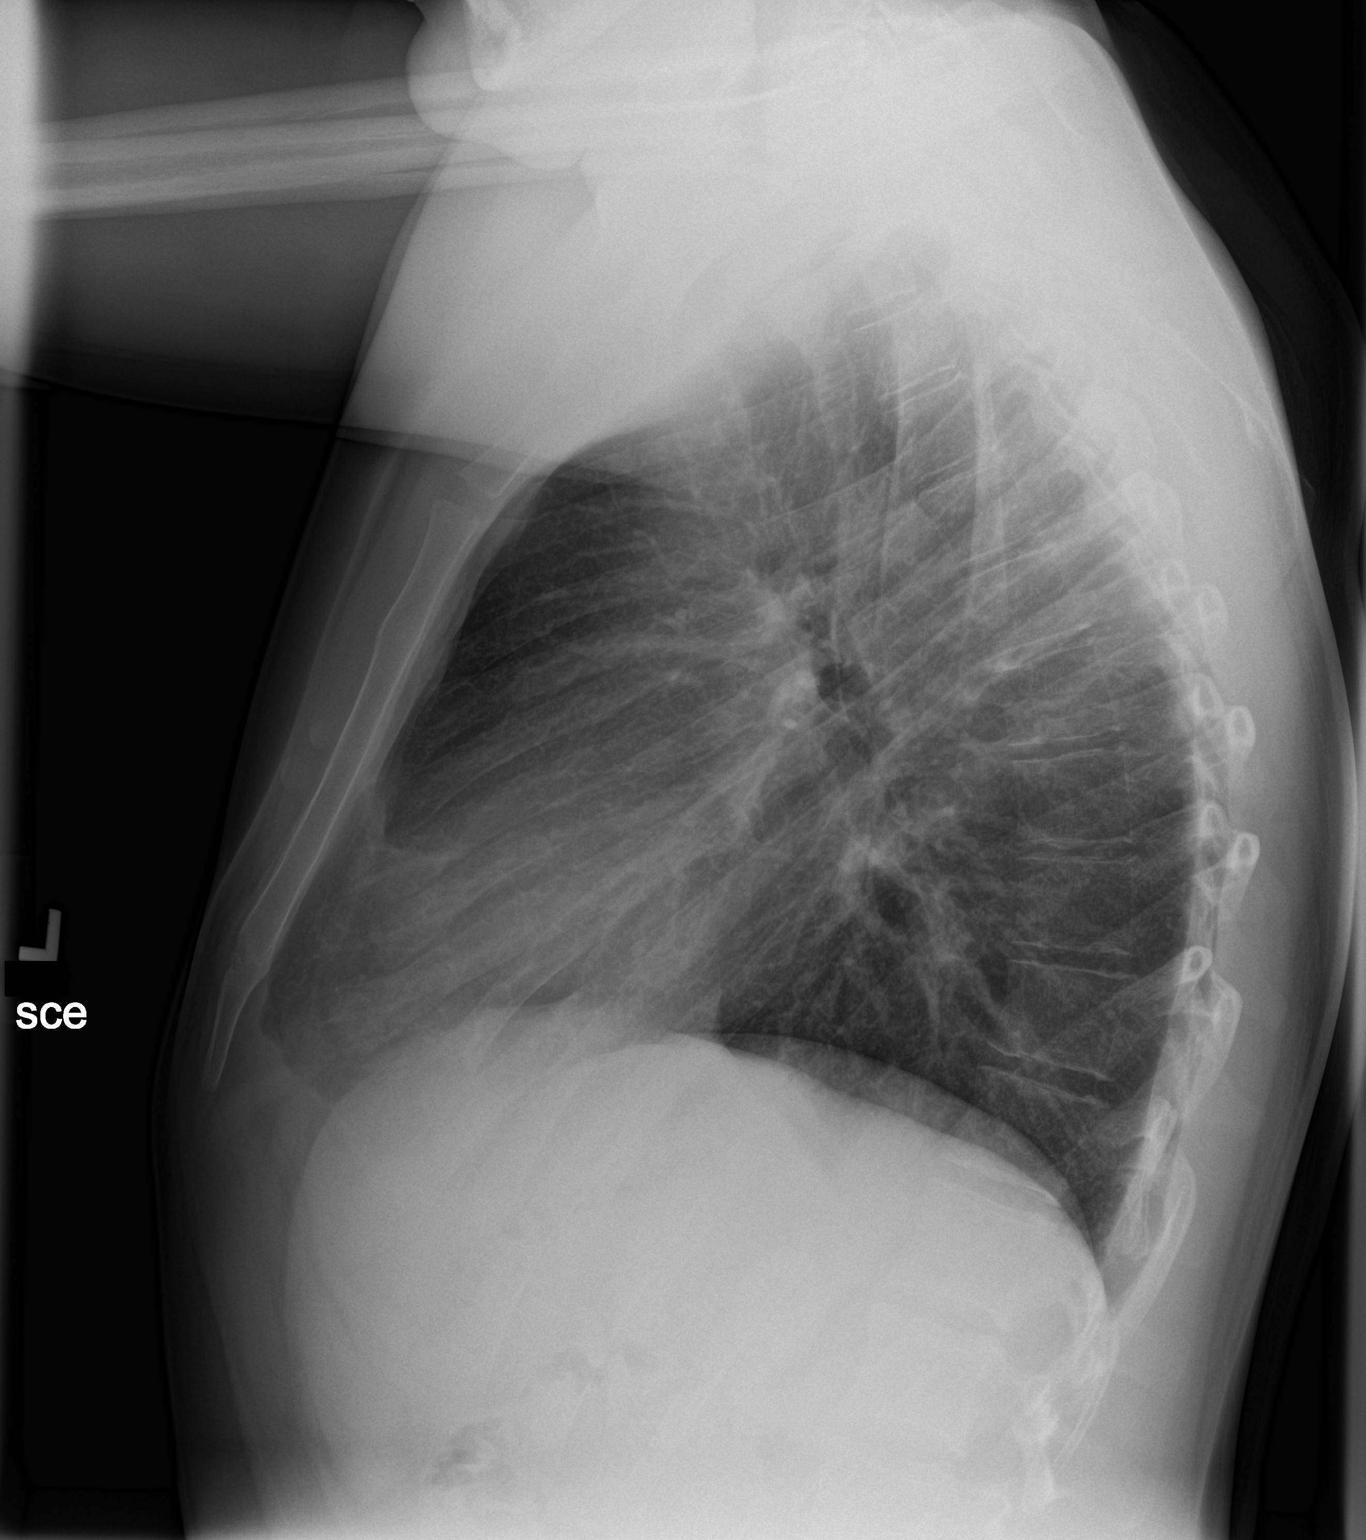

[2 of 2 positions shown; findings below may reference images not displayed]

FINDINGS: Lungs are clear. Heart size and pulmonary vascularity are normal. No
adenopathy. No bone lesions.
IMPRESSION: No edema or consolidation.

## 2016-12-21 ENCOUNTER — Encounter: Payer: Self-pay | Admitting: Emergency Medicine

## 2016-12-21 ENCOUNTER — Emergency Department
Admission: EM | Admit: 2016-12-21 | Discharge: 2016-12-21 | Disposition: A | Discharge status: Home / Self Care | Payer: BLUE CROSS/BLUE SHIELD | Attending: Emergency Medicine | Admitting: Emergency Medicine

## 2016-12-21 DIAGNOSIS — F17200 Nicotine dependence, unspecified, uncomplicated: Secondary | ICD-10-CM | POA: Diagnosis not present

## 2016-12-21 DIAGNOSIS — Z79899 Other long term (current) drug therapy: Secondary | ICD-10-CM | POA: Diagnosis not present

## 2016-12-21 DIAGNOSIS — R519 Headache, unspecified: Secondary | ICD-10-CM

## 2016-12-21 DIAGNOSIS — R51 Headache: Secondary | ICD-10-CM | POA: Insufficient documentation

## 2016-12-21 DIAGNOSIS — Z791 Long term (current) use of non-steroidal anti-inflammatories (NSAID): Secondary | ICD-10-CM | POA: Insufficient documentation

## 2016-12-21 MED ORDER — PROCHLORPERAZINE EDISYLATE 5 MG/ML IJ SOLN
10.0000 mg | Freq: Once | INTRAMUSCULAR | Status: AC
  Administered 2016-12-21: 10 mg via INTRAVENOUS
  Filled 2016-12-21 (×2): qty 2

## 2016-12-21 MED ORDER — PROCHLORPERAZINE MALEATE 10 MG PO TABS: 10.0000 mg | ORAL_TABLET | Freq: Three times a day (TID) | ORAL | 0 refills | Status: DC | PRN

## 2016-12-21 MED ORDER — SODIUM CHLORIDE 0.9 % IV BOLUS (SEPSIS)
1000.0000 mL | Freq: Once | INTRAVENOUS | Status: AC
  Administered 2016-12-21: 1000 mL via INTRAVENOUS

## 2016-12-21 NOTE — ED Provider Notes (Signed)
[facility] Emergency Department Provider Note   ____________________________________________   I have reviewed the triage vital signs and the nursing notes.   HISTORY  Chief Complaint Migraine   History limited by: Not Limited   HPI Bill Harmon is a 26 y.o. male who presents to the emergency department today because of concerns for headaches. The patient states that it is located in the back of his head. The pain is severe. He states that he has a history of migraines back when he was a teenager although started having them again about 2 months ago. Initially they were happening every week however they've been getting more frequent. He does have photo and phonophobia. He has had some associated nausea. Denies any fevers.   Past Medical History:  Diagnosis Date  . Shingles unk    There are no active problems to display for this patient.   Past Surgical History:  Procedure Laterality Date  . APPENDECTOMY      Prior to Admission medications   Medication Sig Start Date End Date Taking? Authorizing Provider  diazepam (VALIUM) 5 MG tablet Take 1 tablet (5 mg total) by mouth every 8 (eight) hours as needed for muscle spasms. 01/01/15   Emily Filbert, MD  HYDROcodone-acetaminophen (NORCO/VICODIN) 5-325 MG per tablet 1-2 tabs po qd prn Patient not taking: Reported on 01/01/2015 12/18/14   Payton Mccallum, MD  ibuprofen (ADVIL,MOTRIN) 200 MG tablet Take 400 mg by mouth every 6 (six) hours as needed for moderate pain.     [provider]  ibuprofen (ADVIL,MOTRIN) 800 MG tablet Take 1 tablet (800 mg total) by mouth every 8 (eight) hours as needed. 01/01/15   Emily Filbert, MD  ondansetron (ZOFRAN) 4 MG tablet Take 1 tablet (4 mg total) by mouth daily as needed for nausea or vomiting. 01/01/15   Emily Filbert, MD  valACYclovir (VALTREX) 1000 MG tablet Take 1 tablet (1,000 mg total) by mouth 3 (three) times daily. Patient not taking: Reported  on 01/01/2015 12/18/14   Payton Mccallum, MD    Allergies Patient has no known allergies.  No family history on file.  Social History Social History  Substance Use Topics  . Smoking status: Current Every Day Smoker  . Smokeless tobacco: Never Used  . Alcohol use No    Review of Systems Constitutional: No fever/chills Eyes: Photophobia ENT: No sore throat. Cardiovascular: Denies chest pain. Respiratory: Denies shortness of breath. Gastrointestinal: No abdominal pain.  Positive for nausea.  Genitourinary: Negative for dysuria. Musculoskeletal: Negative for back pain. Skin: Negative for rash. Neurological: Positive for headache  ____________________________________________   PHYSICAL EXAM:  VITAL SIGNS: ED Triage Vitals  Enc Vitals Group     BP 12/21/16 1946 125/76     Pulse Rate 12/21/16 1946 (!) 54     Resp 12/21/16 1946 18     Temp 12/21/16 1946 97.7 F (36.5 C)     Temp Source 12/21/16 1946 Oral     SpO2 12/21/16 1946 99 %     Weight 12/21/16 1945 170 lb (77.1 kg)     Height 12/21/16 1945 5\' 11"  (1.803 m)     Head Circumference --      Peak Flow --      Pain Score 12/21/16 1944 8   Constitutional: Alert and oriented. Well appearing and in no distress. Eyes: Conjunctivae are normal.  ENT   Head: Normocephalic and atraumatic.   Nose: No congestion/rhinnorhea.   Mouth/Throat: Mucous membranes are moist.  Neck: No stridor. Hematological/Lymphatic/Immunilogical: No cervical lymphadenopathy. Cardiovascular: Normal rate, regular rhythm.  No murmurs, rubs, or gallops.  Respiratory: Normal respiratory effort without tachypnea nor retractions. Breath sounds are clear and equal bilaterally. No wheezes/rales/rhonchi. Gastrointestinal: Soft and non tender. No rebound. No guarding.  Genitourinary: Deferred Musculoskeletal: Normal range of motion in all extremities. No lower extremity edema. Neurologic:  Normal speech and language. No gross focal neurologic  deficits are appreciated.  Skin:  Skin is warm, dry and intact. No rash noted. Psychiatric: Mood and affect are normal. Speech and behavior are normal. Patient exhibits appropriate insight and judgment.  ____________________________________________    LABS (pertinent positives/negatives)  None  ____________________________________________   EKG  None  ____________________________________________    RADIOLOGY  None  ____________________________________________   PROCEDURES  Procedures  ____________________________________________   INITIAL IMPRESSION / ASSESSMENT AND PLAN / ED COURSE  Pertinent labs & imaging results that were available during my care of the patient were reviewed by me and considered in my medical decision making (see chart for details).  Patient presented to the emergency department today because of concerns for headache. This been going on and off for the past 2 months. Additionally the patient has a history of migraines. Patient was given IV fluids and IV Compazine. Patient did feel good relief. Will plan on giving oral Compazine and neurology follow-up information.  ____________________________________________   FINAL CLINICAL IMPRESSION(S) / ED DIAGNOSES  Final diagnoses:  Bad headache     Note: This dictation was prepared with Dragon dictation. Any transcriptional errors that result from this process are unintentional     Phineas SemenGoodman, Yasmin Dibello, MD 12/21/16 2133

## 2016-12-21 NOTE — ED Notes (Signed)
Pt reassessed since compazine and fluids, states he feels " much better"

## 2016-12-21 NOTE — Discharge Instructions (Signed)
Please seek medical attention for any high fevers, chest pain, shortness of breath, change in behavior, persistent vomiting, bloody stool or any other new or concerning symptoms.  

## 2016-12-21 NOTE — ED Triage Notes (Signed)
Patient ambulatory to triage with steady gait, without difficulty or distress noted; pt reports occipital migraine and behind eyes x 3 days accomp by N/V; st migraines as a teenager and recently has begun having them again

## 2017-01-18 ENCOUNTER — Ambulatory Visit
Admission: EM | Admit: 2017-01-18 | Discharge: 2017-01-18 | Disposition: A | Discharge status: Home / Self Care | Payer: BLUE CROSS/BLUE SHIELD | Attending: Family Medicine | Admitting: Family Medicine

## 2017-01-18 DIAGNOSIS — G4452 New daily persistent headache (NDPH): Secondary | ICD-10-CM | POA: Diagnosis not present

## 2017-01-18 MED ORDER — ONDANSETRON 8 MG PO TBDP: 8.0000 mg | ORAL_TABLET | Freq: Two times a day (BID) | ORAL | 0 refills | Status: DC

## 2017-01-18 MED ORDER — SUMATRIPTAN SUCCINATE 6 MG/0.5ML ~~LOC~~ SOLN
6.0000 mg | Freq: Once | SUBCUTANEOUS | Status: AC
  Administered 2017-01-18: 6 mg via SUBCUTANEOUS

## 2017-01-18 MED ORDER — KETOROLAC TROMETHAMINE 60 MG/2ML IM SOLN
60.0000 mg | Freq: Once | INTRAMUSCULAR | Status: AC
  Administered 2017-01-18: 60 mg via INTRAMUSCULAR

## 2017-01-18 MED ORDER — BUTALBITAL-APAP-CAFFEINE 50-325-40 MG PO TABS: 1.0000 | ORAL_TABLET | Freq: Four times a day (QID) | ORAL | 0 refills | Status: AC | PRN

## 2017-01-18 MED ORDER — ONDANSETRON 8 MG PO TBDP
8.0000 mg | ORAL_TABLET | Freq: Once | ORAL | Status: AC
  Administered 2017-01-18: 8 mg via ORAL

## 2017-01-18 NOTE — ED Triage Notes (Signed)
Pt c/o migraine for over a week, with nausea and vomiting. It is light and sound sensitive, he has tired OTC and prescribed meds and they are not helping at all.

## 2017-01-18 NOTE — ED Provider Notes (Signed)
CSN: [id number]     Arrival date & time 01/18/17  1015 History   First MD Initiated Contact with Patient 01/18/17 1155     Chief Complaint  Patient presents with  . Migraine   (Consider location/radiation/quality/duration/timing/severity/associated sxs/prior Treatment) HPI  This a 26 year old male who presents with a headache and he states that he's had for over a week. Actually it is much longer than that because I reviewed his records he was seen at the emergency department at [facility] on 12/21/2016. At that time he had presented with a headache started about 2 months ago. He had the headaches when he was a teenager had a long term in between these restarted again. They're associated with photo and phonophobia. He does have nausea but his vomiting is not feature of his headaches. At the ER visit his pain was in the back of his head today it is over his left temple behind his eye. It has a stabbing quality to it he states that he is not able to take any nonsteroidal anti-inflammatory drugs cause of stomach erosion issues that he had after his appendicitis. At the ED he was given Compazine injection and IV fluids and his headache improved. He does not have any overt localizing neurological complaints other than he vomits every morning upon awakening. The headaches become more frequent and more severe each time. He rates it a 7 out of 10.       Past Medical History:  Diagnosis Date  . Shingles unk   Past Surgical History:  Procedure Laterality Date  . APPENDECTOMY     Family History  Problem Relation Age of Onset  . Diabetes Mother   . Hypertension Mother    Social History  Substance Use Topics  . Smoking status: Current Every Day Smoker  . Smokeless tobacco: Never Used  . Alcohol use No    Review of Systems  Constitutional: Positive for activity change and appetite change. Negative for chills, fatigue and fever.  Neurological: Positive for headaches. Negative for tremors, seizures,  syncope, speech difficulty and weakness.  All other systems reviewed and are negative.   Allergies  Patient has no known allergies.  Home Medications   Prior to Admission medications   Medication Sig Start Date End Date Taking? Authorizing Provider  prochlorperazine (COMPAZINE) 10 MG tablet Take 1 tablet (10 mg total) by mouth every 8 (eight) hours as needed (headache). 12/21/16  Yes Phineas Semen, MD  butalbital-acetaminophen-caffeine (FIORICET, ESGIC) [phone] MG tablet Take 1-2 tablets by mouth every 6 (six) hours as needed for headache. Do not take more than 6 in 24 hours 01/18/17 01/18/18  Ovid Curd P, PA-C  ondansetron (ZOFRAN ODT) 8 MG disintegrating tablet Take 1 tablet (8 mg total) by mouth 2 (two) times daily. 01/18/17   Lutricia Feil, PA-C   Meds Ordered and Administered this Visit   Medications  ondansetron (ZOFRAN-ODT) disintegrating tablet 8 mg (8 mg Oral Given 01/18/17 1222)  ketorolac (TORADOL) injection 60 mg (60 mg Intramuscular Given 01/18/17 1223)  SUMAtriptan (IMITREX) injection 6 mg (6 mg Subcutaneous Given 01/18/17 1311)    BP 115/75 (BP Location: Left Arm)   Pulse 65   Temp 98.6 F (37 C) (Oral)   Resp 18   Ht 5\' 11"  (1.803 m)   Wt 170 lb (77.1 kg)   SpO2 98%   BMI 23.71 kg/m  No data found.   Physical Exam  Constitutional: He is oriented to person, place, and time. He appears well-developed and  well-nourished. No distress.  HENT:  Head: Normocephalic.  Right Ear: External ear normal.  Left Ear: External ear normal.  Mouth/Throat: Oropharynx is clear and moist.  Eyes: EOM are normal. Pupils are equal, round, and reactive to light. Right eye exhibits no discharge. Left eye exhibits no discharge.  Neck: Normal range of motion. Neck supple.  Musculoskeletal: Normal range of motion.  Lymphadenopathy:    He has no cervical adenopathy.  Neurological: He is alert and oriented to person, place, and time. He displays normal reflexes. No cranial  nerve deficit or sensory deficit. He exhibits normal muscle tone. Coordination normal.  Skin: Skin is warm and dry. He is not diaphoretic.  Psychiatric: He has a normal mood and affect. His behavior is normal. Judgment and thought content normal.  Nursing note and vitals reviewed.   Urgent Care Course     Procedures (including critical care time)  Labs Review Labs Reviewed - No data to display  Imaging Review No results found.   Visual Acuity Review  Right Eye Distance:   Left Eye Distance:   Bilateral Distance:    Right Eye Near:   Left Eye Near:    Bilateral Near:     Medications  ondansetron (ZOFRAN-ODT) disintegrating tablet 8 mg (8 mg Oral Given 01/18/17 1222)  ketorolac (TORADOL) injection 60 mg (60 mg Intramuscular Given 01/18/17 1223)  SUMAtriptan (IMITREX) injection 6 mg (6 mg Subcutaneous Given 01/18/17 1311)    Patient's headache started at a 7 out of 10 reduced to a 5-6 out of 10 after the Toradol but increased to 7 out of 10 again after the sumatriptan  A CT of the head was ordered but his insurance company would not approve it because of payment not allowed [facility]. The closest center that they would permit the CT to be approved was 27 miles away. This was explained to the patient. He states that he will go to [facility] emergency department tonight where there should not be a problem with payment to the emergency room.  MDM   1. New daily persistent headache    Discharge Medication List as of 01/18/2017  1:44 PM    START taking these medications   Details  butalbital-acetaminophen-caffeine (FIORICET, ESGIC) 50-325-40 MG tablet Take 1-2 tablets by mouth every 6 (six) hours as needed for headache. Do not take more than 6 in 24 hours, Starting Tue 01/18/2017, Until Wed 01/18/2018, Print    ondansetron (ZOFRAN ODT) 8 MG disintegrating tablet Take 1 tablet (8 mg total) by mouth 2 (two) times daily., Starting Tue  01/18/2017, Normal      Plan: 1. Test/x-ray results and diagnosis reviewed with patient 2. rx as per orders; risks, benefits, potential side effects reviewed with patient 3. Recommend supportive treatment with Use of fears at this afternoon and resting. Keep him out of work today and allow him to return at his next shift tomorrow. I have highly recommended that he have further evaluation by neurologist and the name of a local neurologist was provided to the patient. If he continues to have problems he should go to emergency department tonight. 4. F/u prn if symptoms worsen or don't improve     Lutricia FeilRoemer, Aleeha Boline P, PA-C 01/18/17 1447

## 2017-01-19 ENCOUNTER — Emergency Department
Admission: EM | Admit: 2017-01-19 | Discharge: 2017-01-19 | Disposition: A | Discharge status: Home / Self Care | Payer: BLUE CROSS/BLUE SHIELD | Attending: Emergency Medicine | Admitting: Emergency Medicine

## 2017-01-19 DIAGNOSIS — Z79899 Other long term (current) drug therapy: Secondary | ICD-10-CM | POA: Diagnosis not present

## 2017-01-19 DIAGNOSIS — F17200 Nicotine dependence, unspecified, uncomplicated: Secondary | ICD-10-CM | POA: Diagnosis not present

## 2017-01-19 DIAGNOSIS — R519 Headache, unspecified: Secondary | ICD-10-CM

## 2017-01-19 DIAGNOSIS — R51 Headache: Secondary | ICD-10-CM | POA: Insufficient documentation

## 2017-01-19 MED ORDER — KETOROLAC TROMETHAMINE 30 MG/ML IJ SOLN
15.0000 mg | Freq: Once | INTRAMUSCULAR | Status: DC
  Filled 2017-01-19: qty 1

## 2017-01-19 MED ORDER — ACETAMINOPHEN 500 MG PO TABS
1000.0000 mg | ORAL_TABLET | Freq: Once | ORAL | Status: DC
  Filled 2017-01-19: qty 2

## 2017-01-19 MED ORDER — SODIUM CHLORIDE 0.9 % IV BOLUS (SEPSIS): 1000.0000 mL | Freq: Once | INTRAVENOUS | Status: DC

## 2017-01-19 MED ORDER — METOCLOPRAMIDE HCL 5 MG/ML IJ SOLN
10.0000 mg | Freq: Once | INTRAMUSCULAR | Status: DC
  Filled 2017-01-19: qty 2

## 2017-01-19 MED ORDER — DIPHENHYDRAMINE HCL 50 MG/ML IJ SOLN
25.0000 mg | Freq: Once | INTRAMUSCULAR | Status: DC
  Filled 2017-01-19: qty 1

## 2017-01-19 NOTE — ED Triage Notes (Signed)
Pt states that he has had a headache for over a week with no relief from any OTC pain medication.  Pt states that he was given 2 shots at urgent care earlier today with no relief.  Urgent care provider told pt that he needed to come to the ER to get a CT scan of his head due to frequency and recurrence of headaches.  Pt states that he has light and sound sensitivity as well as n/v with headaches.  Pt is A&Ox4, in NAD and ambulatory to triage.

## 2017-01-19 NOTE — Discharge Instructions (Signed)

## 2017-01-19 NOTE — ED Notes (Signed)
Pt states that he has bad migraines for the past few months. States that his insurance wont pay for a CT scan through the urgent care so he was told to come here to ED for CT scan.

## 2017-01-19 NOTE — ED Notes (Signed)
E-signature box not working. Pt verbalized understanding of discharge instructions and denied questions. 

## 2017-01-19 NOTE — ED Provider Notes (Signed)
[facility] Emergency Department Provider Note  ____________________________________________  Time seen: Approximately 7:54 AM  I have reviewed the triage vital signs and the nursing notes.   HISTORY  Chief Complaint Headache   HPI Bill Harmon is a 26 y.o. male with a history of migraine headaches who presents for evaluation of a headache. Patient reports that he used to have headaches very frequently and was diagnosed by a neurologist with migraines when he was younger. Over the course of the last few months his migraines are back. He has not been able to associate them with any food, stress, or any environmental factors. No changes in his home or work environment. Patient has been seen in multiple emergency departments and urgent care. He has tried Tylenol, ibuprofen, BC with no relief. Went to urgent care yesterday and was told that he needed a CT scan however his insurance would not approve it. Patient is here today requesting a CT scan. He reports that his headache is currently 6 out of 10, throbbing, diffuse, associated with photophobia and phonophobia and has been present for 1 week. Has had intermittent nausea which is classic for his migraine headaches.  Patient denies changes in vision, weakness or numbness, sudden onset HA or HA with maximal intensity at onset, fever, neck stiffness, history of immunosuppression, Jaw claudication, muscle aches, temporal artery pain, history of other household members with similar symptoms, clotting disorder, trauma, eye pain, recent cervical manipulation, or dizziness with headache. Patient endorses occasional alcohol and occasional marijuana use. He is not a smoker. Denies any other drug use.   Past Medical History:  Diagnosis Date  . Shingles unk    There are no active problems to display for this patient.   Past Surgical History:  Procedure Laterality Date  . APPENDECTOMY      Prior to Admission medications     Medication Sig Start Date End Date Taking? Authorizing Provider  butalbital-acetaminophen-caffeine (FIORICET, ESGIC) 50-325-40 MG tablet Take 1-2 tablets by mouth every 6 (six) hours as needed for headache. Do not take more than 6 in 24 hours 01/18/17 01/18/18  Ovid Curd P, PA-C  ondansetron (ZOFRAN ODT) 8 MG disintegrating tablet Take 1 tablet (8 mg total) by mouth 2 (two) times daily. 01/18/17   Lutricia Feil, PA-C  prochlorperazine (COMPAZINE) 10 MG tablet Take 1 tablet (10 mg total) by mouth every 8 (eight) hours as needed (headache). 12/21/16   Phineas Semen, MD    Allergies Patient has no known allergies.  Family History  Problem Relation Age of Onset  . Diabetes Mother   . Hypertension Mother     Social History Social History  Substance Use Topics  . Smoking status: Current Every Day Smoker  . Smokeless tobacco: Never Used  . Alcohol use No    Review of Systems  Constitutional: Negative for fever. Eyes: Negative for visual changes. ENT: Negative for sore throat. Neck: No neck pain  Cardiovascular: Negative for chest pain. Respiratory: Negative for shortness of breath. Gastrointestinal: Negative for abdominal pain, vomiting or diarrhea. Genitourinary: Negative for dysuria. Musculoskeletal: Negative for back pain. Skin: Negative for rash. Neurological: Negative for  weakness or numbness. + HA Psych: No SI or HI  ____________________________________________   PHYSICAL EXAM:  VITAL SIGNS: ED Triage Vitals  Enc Vitals Group     BP 01/19/17 0336 128/72     Pulse Rate 01/19/17 0336 60     Resp 01/19/17 0336 18     Temp 01/19/17 0336  98.2 F (36.8 C)     Temp Source 01/19/17 0336 Oral     SpO2 01/19/17 0336 97 %     Weight 01/19/17 0332 170 lb (77.1 kg)     Height 01/19/17 0332 5\' 11"  (1.803 m)     Head Circumference --      Peak Flow --      Pain Score 01/19/17 0332 6     Pain Loc --      Pain Edu? --      Excl. in GC? --     Constitutional:  Alert and oriented. Well appearing and in no apparent distress. HEENT:      Head: Normocephalic and atraumatic.         Eyes: Conjunctivae are normal. Sclera is non-icteric.       Mouth/Throat: Mucous membranes are moist.       Neck: Supple with no signs of meningismus. Cardiovascular: Regular rate and rhythm. No murmurs, gallops, or rubs. 2+ symmetrical distal pulses are present in all extremities. No JVD. Respiratory: Normal respiratory effort. Lungs are clear to auscultation bilaterally. No wheezes, crackles, or rhonchi.  Gastrointestinal: Soft, non tender, and non distended with positive bowel sounds. No rebound or guarding. Genitourinary: No CVA tenderness. Musculoskeletal: Nontender with normal range of motion in all extremities. No edema, cyanosis, or erythema of extremities. Neurologic: Normal speech and language. A & O x3, PERRL, no nystagmus, CN II-XII intact, motor testing reveals good tone and bulk throughout. There is no evidence of pronator drift or dysmetria. Muscle strength is 5/5 throughout. Deep tendon reflexes are 2+ throughout with downgoing toes. Sensory examination is intact. Gait is normal. Skin: Skin is warm, dry and intact. No rash noted. Psychiatric: Mood and affect are normal. Speech and behavior are normal.  ____________________________________________   LABS (all labs ordered are listed, but only abnormal results are displayed)  Labs Reviewed - No data to display ____________________________________________  EKG  None   ____________________________________________  RADIOLOGY  none  ____________________________________________   PROCEDURES  Procedure(s) performed: None Procedures Critical Care performed:  None ____________________________________________   INITIAL IMPRESSION / ASSESSMENT AND PLAN / ED COURSE   26 y.o. male with a history of migraine headaches who presents for evaluation of a headache.  Low suspicion for more serious or life  threatening etiology of HA based on history and exam. No sudden onset thunderclap HA, onset with exertion, vomiting, focal neurologic deficits, to suggest increased risk of subarachnoid hemorrhage. No fever, neck pain, neck stiffness, or meningismus on exam to suggest meningitis. No fevers, altered mental status, unusual behavior to suggest encephalitis. No focal neurologic deficits by history or exam to suggest central venous thrombosis. No constitutional symptoms including fever, fatigue, weight loss, temporal scalp tenderness, jaw claudication, visual loss, to suggest temporal arteritis. No immunocompromise to suggest increased risk for intracranial infectious disease. No visual changes or findings on ocular exam to suggest acute angle closure glaucoma. No reports of toxic exposures including carbon monoxide or other household members with similar symptoms.  I discussed with the patient risks and benefits of undergoing a head CT at this time especially with a history of migraine headaches, headaches that are identical to his migraines, neurologically intact with no evidence of more serious etiology at this time. Patient has opted to not undergo a head CT. He has been referred to neurology and I recommended that he follows up with Dr. Sherryll BurgerShah for further management. I ordered migraine cocktail but patient refused and told me that he  does not want anymore pain medicine. I also offered to call Dr. Sherryll Burger to see if recommended patient to be started on any prophylaxis at this time until patient is able to see him but patient declined and requested to be discharged. Return precautions were provided to patient both in written and verbally.     Pertinent labs & imaging results that were available during my care of the patient were reviewed by me and considered in my medical decision making (see chart for details).    ____________________________________________   FINAL CLINICAL IMPRESSION(S) / ED  DIAGNOSES  Final diagnoses:  Acute nonintractable headache, unspecified headache type      NEW MEDICATIONS STARTED DURING THIS VISIT:  New Prescriptions   No medications on file     Note:  This document was prepared using Dragon voice recognition software and may include unintentional dictation errors.    Don Perking, [place], MD 01/22/17 [phone]

## 2017-01-20 NOTE — ED Notes (Signed)
Confirmation Number [id number] Valid Dates January 18, 2017 February 16, 2017 Rep: Baron SaneFelicia S.

## 2017-01-31 ENCOUNTER — Other Ambulatory Visit: Payer: Self-pay | Admitting: Neurology

## 2017-01-31 DIAGNOSIS — R51 Headache: Principal | ICD-10-CM

## 2017-01-31 DIAGNOSIS — R519 Headache, unspecified: Secondary | ICD-10-CM

## 2017-02-12 ENCOUNTER — Other Ambulatory Visit: Payer: BLUE CROSS/BLUE SHIELD

## 2019-04-04 ENCOUNTER — Emergency Department
Admission: EM | Admit: 2019-04-04 | Discharge: 2019-04-04 | Disposition: A | Discharge status: Home / Self Care | Payer: BLUE CROSS/BLUE SHIELD | Attending: Emergency Medicine | Admitting: Emergency Medicine

## 2019-04-04 ENCOUNTER — Other Ambulatory Visit: Payer: Self-pay

## 2019-04-04 DIAGNOSIS — F17200 Nicotine dependence, unspecified, uncomplicated: Secondary | ICD-10-CM | POA: Insufficient documentation

## 2019-04-04 DIAGNOSIS — T675XXA Heat exhaustion, unspecified, initial encounter: Secondary | ICD-10-CM | POA: Insufficient documentation

## 2019-04-04 DIAGNOSIS — T671XXA Heat syncope, initial encounter: Secondary | ICD-10-CM | POA: Insufficient documentation

## 2019-04-04 LAB — CBC
HCT: 46.5 % (ref 39.0–52.0)
Hemoglobin: 15.7 g/dL (ref 13.0–17.0)
MCH: 30 pg (ref 26.0–34.0)
MCHC: 33.8 g/dL (ref 30.0–36.0)
MCV: 88.7 fL (ref 80.0–100.0)
Platelets: 240 10*3/uL (ref 150–400)
RBC: 5.24 MIL/uL (ref 4.22–5.81)
RDW: 12.4 % (ref 11.5–15.5)
WBC: 18.1 10*3/uL — ABNORMAL HIGH (ref 4.0–10.5)
nRBC: 0 % (ref 0.0–0.2)

## 2019-04-04 LAB — COMPREHENSIVE METABOLIC PANEL
ALT: 82 U/L — ABNORMAL HIGH (ref 0–44)
AST: 44 U/L — ABNORMAL HIGH (ref 15–41)
Albumin: 4.6 g/dL (ref 3.5–5.0)
Alkaline Phosphatase: 68 U/L (ref 38–126)
Anion gap: 11 (ref 5–15)
BUN: 13 mg/dL (ref 6–20)
CO2: 26 mmol/L (ref 22–32)
Calcium: 10.2 mg/dL (ref 8.9–10.3)
Chloride: 102 mmol/L (ref 98–111)
Creatinine, Ser: 1.23 mg/dL (ref 0.61–1.24)
GFR calc Af Amer: >60 mL/min (ref >60)
GFR calc non Af Amer: >60 mL/min (ref >60)
Glucose, Bld: 74 mg/dL (ref 70–99)
Potassium: 3.9 mmol/L (ref 3.5–5.1)
Sodium: 139 mmol/L (ref 135–145)
Total Bilirubin: 0.7 mg/dL (ref 0.3–1.2)
Total Protein: 8.1 g/dL (ref 6.5–8.1)

## 2019-04-04 LAB — GLUCOSE, CAPILLARY
Glucose-Capillary: 165 mg/dL — ABNORMAL HIGH (ref 70–99)
Glucose-Capillary: 66 mg/dL — ABNORMAL LOW (ref 70–99)

## 2019-04-04 NOTE — ED Provider Notes (Signed)
[facility] Emergency Department Provider Note   ____________________________________________    I have reviewed the triage vital signs and the nursing notes.   HISTORY  Chief Complaint Syncopal episode    HPI Bill Harmon is a 28 y.o. male who presents after possible syncopal episode.  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  Did have some jerking of his hands per staff.  Did have urinary incontinence.  No postictal period however.  No history of seizures.  No headache.  No neuro deficits.  Past Medical History:  Diagnosis Date  . Shingles unk    There are no active problems to display for this patient.   Past Surgical History:  Procedure Laterality Date  . APPENDECTOMY      Prior to Admission medications   Medication Sig Start Date End Date Taking? Authorizing Provider  ibuprofen (ADVIL) 200 MG tablet Take 400-600 mg by mouth every 6 (six) hours as needed for fever or mild pain.   Yes [provider]     Allergies Patient has no known allergies.  Family History  Problem Relation Age of Onset  . Diabetes Mother   . Hypertension Mother     Social History Social History   Tobacco Use  . Smoking status: Current Every Day Smoker  . Smokeless tobacco: Never Used  Substance Use Topics  . Alcohol use: No  . Drug use: Yes    Frequency: 1.0 times per week    Types: Marijuana    Review of Systems  Constitutional: Feels "wiped out " Eyes: No visual changes.  ENT: No neck pain, no tongue injury Cardiovascular: Denies chest pain. Respiratory: Denies shortness of breath. Gastrointestinal: No abdominal pain.  No nausea, no vomiting.   Genitourinary: As above  Musculoskeletal: Negative for back pain. Skin: Negative for  rash. Neurological: No headache no neuro deficits   ____________________________________________   PHYSICAL EXAM:  VITAL SIGNS: ED Triage Vitals  Enc Vitals Group     BP 04/04/19 1333 127/65     Pulse Rate 04/04/19 1333 84     Resp 04/04/19 1333 18     Temp 04/04/19 1333 98.3 F (36.8 C)     Temp Source 04/04/19 1333 Oral     SpO2 04/04/19 1333 98 %     Weight 04/04/19 1334 77.1 kg (170 lb)     Height 04/04/19 1334 1.803 m (5\' 11" )     Head Circumference --      Peak Flow --      Pain Score 04/04/19 1333 0     Pain Loc --      Pain Edu? --      Excl. in GC? --     Constitutional: Alert and oriented.  Eyes: Conjunctivae are normal.   Nose: No congestion/rhinnorhea. Mouth/Throat: Mucous membranes are moist.    Cardiovascular: Normal rate, regular rhythm. Grossly normal heart sounds.  Good peripheral circulation. Respiratory: Normal respiratory effort.  No retractions. Lungs CTAB. Gastrointestinal: Soft and nontender. No distention.  No CVA tenderness.  Musculoskeletal: No lower extremity tenderness nor edema.  Warm and well perfused Neurologic:  Normal speech and language. No gross focal neurologic deficits are appreciated.  Skin:  Skin is warm, dry, diaphoretic Psychiatric: Mood and affect are normal. Speech and  behavior are normal.  ____________________________________________   LABS (all labs ordered are listed, but only abnormal results are displayed)  Labs Reviewed  CBC - Abnormal; Notable for the following components:      Result Value   WBC 18.1 (*)    All other components within normal limits  GLUCOSE, CAPILLARY - Abnormal; Notable for the following components:   Glucose-Capillary 66 (*)    All other components within normal limits  COMPREHENSIVE METABOLIC PANEL - Abnormal; Notable for the following components:   AST 44 (*)    ALT 82 (*)    All other components within normal limits  GLUCOSE, CAPILLARY - Abnormal; Notable for the following components:    Glucose-Capillary 165 (*)    All other components within normal limits  CBG MONITORING, ED   ____________________________________________  EKG  ED ECG REPORT I, Lavonia Drafts, the attending physician, personally viewed and interpreted this ECG.  Date: 04/04/2019  Rhythm: normal sinus rhythm QRS Axis: normal Intervals: normal ST/T Wave abnormalities: normal Narrative Interpretation: no evidence of acute ischemia  ____________________________________________  RADIOLOGY  None ____________________________________________   PROCEDURES  Procedure(s) performed: No  Procedures   Critical Care performed: No ____________________________________________   INITIAL IMPRESSION / ASSESSMENT AND PLAN / ED COURSE  Pertinent labs & imaging results that were available during my care of the patient were reviewed by me and considered in my medical decision making (see chart for details).  Patient presents after likely syncopal episode related to heat exhaustion.  Possibility of seizure but less likely given no postictal.  He was diaphoretic here with reassuring vitals, will give IV fluids, glucose 66 will give juice.  Pending  Labs, on the cardiac monitor, EKG normal  Patient's lab work is overall reassuring, elevated white blood cell count is quite nonspecific could be seen syncopal episode related heat exhaustion but also seen in seizure.  Continue to doubt seizure given feeling exhausted from heat, lightheaded and sitting down without postictal period.  Patient is feeling quite well at this time we will continue to monitor   ----------------------------------------- 4:10 PM on 04/04/2019 -----------------------------------------  Patient is feeling well, at this point appropriate for discharge, outpatient follow-up PCP, return precautions discussed    ____________________________________________   FINAL CLINICAL IMPRESSION(S) / ED DIAGNOSES  Final diagnoses:  Heat  exhaustion, initial encounter  Heat syncope, initial encounter        Note:  This document was prepared using Dragon voice recognition software and may include unintentional dictation errors.   Lavonia Drafts, MD 04/04/19 [phone]

## 2019-04-04 NOTE — ED Notes (Signed)
Checked blood sugar level 165

## 2019-04-04 NOTE — ED Triage Notes (Addendum)
Pt reports he was at work, got hot working outside and felt dizzy-laid head down on table. Next he remembers awakening, confused, exhausted and had urinated on himself. Pt coworkers report contractures to bilateral arms and a loud moaning/groaning prior to "seizure", unknown how long episode lasted. Coworker told pt he had seizure. No hx of seizure. No recent changes in medicines, no new sicknesses. No injury to tongue noted. Pt did not fall out chair when "seizure" occurred. Pt alert and oriented X4, cooperative, RR even and unlabored, color WNL. Pt in NAD.

## 2019-04-04 NOTE — ED Notes (Addendum)
After checking CBG, this RN went to get pt apple juice and crackers due to hypoglycemia; pt began to state that he did not feel well. When entering room, pt eyes closed, pt pale, diaphoretic and not responding to Probation officer. Taken back via wheelchair to room 14 and EDP paged to bedside. Pt opens eyes and is able to assist with movement from wheelchair to stretcher. Pt then states he did not pass out but was too weak to open eyes or respond to questions. Pt very diaphoretic and pale. EDP and primary RN at bedside.

## 2019-04-04 NOTE — ED Notes (Signed)
Fiancee at bedside. 

## 2019-04-04 NOTE — ED Notes (Signed)
Pt given apple juice
